# Patient Record
Sex: Male | Born: 1950 | Race: Black or African American | Hispanic: No | Marital: Single | Smoking: Former smoker
Health system: Southern US, Community
[De-identification: ages and names within clinical notes are randomized; demographics above are authoritative.]

## PROBLEM LIST (undated history)

## (undated) DIAGNOSIS — K922 Gastrointestinal hemorrhage, unspecified: Secondary | ICD-10-CM

## (undated) DIAGNOSIS — B1920 Unspecified viral hepatitis C without hepatic coma: Secondary | ICD-10-CM

## (undated) DIAGNOSIS — E785 Hyperlipidemia, unspecified: Secondary | ICD-10-CM

## (undated) DIAGNOSIS — J309 Allergic rhinitis, unspecified: Secondary | ICD-10-CM

## (undated) DIAGNOSIS — Z9289 Personal history of other medical treatment: Secondary | ICD-10-CM

## (undated) DIAGNOSIS — I10 Essential (primary) hypertension: Secondary | ICD-10-CM

## (undated) DIAGNOSIS — E119 Type 2 diabetes mellitus without complications: Secondary | ICD-10-CM

## (undated) DIAGNOSIS — E1069 Type 1 diabetes mellitus with other specified complication: Secondary | ICD-10-CM

---

## 2001-08-17 ENCOUNTER — Encounter: Admission: RE | Admit: 2001-08-17 | Discharge: 2001-11-15 | Payer: Self-pay | Admitting: Family Medicine

## 2001-10-10 ENCOUNTER — Encounter: Payer: Self-pay | Admitting: Gastroenterology

## 2001-10-10 ENCOUNTER — Ambulatory Visit (HOSPITAL_COMMUNITY): Admission: RE | Admit: 2001-10-10 | Discharge: 2001-10-10 | Payer: Self-pay | Admitting: Gastroenterology

## 2001-11-14 ENCOUNTER — Ambulatory Visit (HOSPITAL_COMMUNITY): Admission: RE | Admit: 2001-11-14 | Discharge: 2001-11-14 | Payer: Self-pay | Admitting: Gastroenterology

## 2001-11-14 ENCOUNTER — Encounter (INDEPENDENT_AMBULATORY_CARE_PROVIDER_SITE_OTHER): Payer: Self-pay | Admitting: Specialist

## 2001-11-14 ENCOUNTER — Encounter: Payer: Self-pay | Admitting: Gastroenterology

## 2002-10-26 ENCOUNTER — Encounter (INDEPENDENT_AMBULATORY_CARE_PROVIDER_SITE_OTHER): Payer: Self-pay | Admitting: Specialist

## 2002-10-26 ENCOUNTER — Ambulatory Visit (HOSPITAL_COMMUNITY): Admission: RE | Admit: 2002-10-26 | Discharge: 2002-10-26 | Payer: Self-pay | Admitting: Gastroenterology

## 2004-11-30 ENCOUNTER — Emergency Department (HOSPITAL_COMMUNITY): Admission: EM | Admit: 2004-11-30 | Discharge: 2004-12-01 | Payer: Self-pay | Admitting: Emergency Medicine

## 2009-02-01 ENCOUNTER — Emergency Department (HOSPITAL_COMMUNITY): Admission: EM | Admit: 2009-02-01 | Discharge: 2009-02-01 | Payer: Self-pay | Admitting: Emergency Medicine

## 2010-10-03 NOTE — Op Note (Signed)
   NAME:  Arthur Lawrence, Arthur Lawrence                          ACCOUNT NO.:  [id number]   MEDICAL RECORD NO.:  [id number]                   PATIENT TYPE:  AMB   LOCATION:  ENDO                                 FACILITY:  [facility]   PHYSICIAN:  John C. Madilyn Fireman, M.D.                 DATE OF BIRTH:  08/20/1950   DATE OF PROCEDURE:  10/26/2002  DATE OF DISCHARGE:                                 OPERATIVE REPORT   PROCEDURE:  Colonoscopy with polypectomy.   INDICATIONS FOR PROCEDURE:  Colon cancer screening.   DESCRIPTION OF PROCEDURE:  The patient was placed in the left lateral  decubitus position and placed on the pulse monitor with continuous low-flow  oxygen delivered by nasal cannula.  He was sedated with 62.5 mcg IV  fentanyl, 6 mg IV Versed.  The Olympus video colonoscope was inserted into  the rectum and advanced to the cecum, confirmed by transillumination of  McBurney's point and visualization of the ileocecal valve and appendiceal  orifice.  Prep was excellent.  The cecum, ascending and transverse all  appeared normal  with no masses, polyps, diverticula, or other mucosal  abnormalities.  Within the descending colon, there was a 1 cm sessile polyp  removed by snare.  A similar polyp was seen in the sigmoid colon.  The  remainder of the sigmoid and rectum appeared normal with no further polyps,  masses, diverticula, or other mucosal abnormalities. Retroflexed view of the  anus revealed no obvious internal hemorrhoids.  The colonoscope was then  withdrawn and the patient returned to the recovery room in stable condition.  He tolerated the procedure well and there were no immediate complications.   IMPRESSION:  Descending colon and sigmoid colon polyps.   PLAN:  Await histology to determine method and interval for future colon  screening.                                               John C. Madilyn Fireman, M.D.    [facility]/MEDQ  D:  10/26/2002  T:  10/26/2002  Job:  [id number]   cc:   Talmadge Coventry,  M.D.  [address], [address]  [place]  [place] [address]  Fax: [phone]

## 2011-05-19 HISTORY — PX: LAPAROSCOPIC CHOLECYSTECTOMY: SUR755

## 2014-02-26 ENCOUNTER — Encounter (HOSPITAL_COMMUNITY): Admission: EM | Disposition: E | Payer: Self-pay | Source: Home / Self Care | Attending: Internal Medicine

## 2014-02-26 ENCOUNTER — Inpatient Hospital Stay (HOSPITAL_COMMUNITY)
Admission: EM | Admit: 2014-02-26 | Discharge: 2014-03-18 | DRG: 377 | Disposition: E | Payer: BC Managed Care – PPO | Attending: Internal Medicine | Admitting: Internal Medicine

## 2014-02-26 ENCOUNTER — Encounter (HOSPITAL_COMMUNITY): Payer: Self-pay | Admitting: Emergency Medicine

## 2014-02-26 ENCOUNTER — Emergency Department (HOSPITAL_COMMUNITY): Payer: BC Managed Care – PPO

## 2014-02-26 DIAGNOSIS — Z7982 Long term (current) use of aspirin: Secondary | ICD-10-CM | POA: Diagnosis not present

## 2014-02-26 DIAGNOSIS — R4189 Other symptoms and signs involving cognitive functions and awareness: Secondary | ICD-10-CM

## 2014-02-26 DIAGNOSIS — R6510 Systemic inflammatory response syndrome (SIRS) of non-infectious origin without acute organ dysfunction: Secondary | ICD-10-CM | POA: Diagnosis present

## 2014-02-26 DIAGNOSIS — G8194 Hemiplegia, unspecified affecting left nondominant side: Secondary | ICD-10-CM | POA: Diagnosis present

## 2014-02-26 DIAGNOSIS — K219 Gastro-esophageal reflux disease without esophagitis: Secondary | ICD-10-CM | POA: Diagnosis present

## 2014-02-26 DIAGNOSIS — E118 Type 2 diabetes mellitus with unspecified complications: Secondary | ICD-10-CM

## 2014-02-26 DIAGNOSIS — R748 Abnormal levels of other serum enzymes: Secondary | ICD-10-CM | POA: Diagnosis present

## 2014-02-26 DIAGNOSIS — N179 Acute kidney failure, unspecified: Secondary | ICD-10-CM | POA: Diagnosis not present

## 2014-02-26 DIAGNOSIS — D62 Acute posthemorrhagic anemia: Secondary | ICD-10-CM | POA: Diagnosis present

## 2014-02-26 DIAGNOSIS — I739 Peripheral vascular disease, unspecified: Secondary | ICD-10-CM | POA: Diagnosis present

## 2014-02-26 DIAGNOSIS — I9589 Other hypotension: Secondary | ICD-10-CM

## 2014-02-26 DIAGNOSIS — R40244 Other coma, without documented Glasgow coma scale score, or with partial score reported, unspecified time: Secondary | ICD-10-CM

## 2014-02-26 DIAGNOSIS — R578 Other shock: Secondary | ICD-10-CM | POA: Diagnosis present

## 2014-02-26 DIAGNOSIS — K922 Gastrointestinal hemorrhage, unspecified: Secondary | ICD-10-CM | POA: Diagnosis present

## 2014-02-26 DIAGNOSIS — R569 Unspecified convulsions: Secondary | ICD-10-CM

## 2014-02-26 DIAGNOSIS — I6319 Cerebral infarction due to embolism of other precerebral artery: Secondary | ICD-10-CM | POA: Diagnosis present

## 2014-02-26 DIAGNOSIS — R40243 Glasgow coma scale score 3-8, unspecified time: Secondary | ICD-10-CM

## 2014-02-26 DIAGNOSIS — R000 Tachycardia, unspecified: Secondary | ICD-10-CM | POA: Diagnosis present

## 2014-02-26 DIAGNOSIS — K5731 Diverticulosis of large intestine without perforation or abscess with bleeding: Secondary | ICD-10-CM | POA: Diagnosis present

## 2014-02-26 DIAGNOSIS — R579 Shock, unspecified: Secondary | ICD-10-CM | POA: Diagnosis present

## 2014-02-26 DIAGNOSIS — E1069 Type 1 diabetes mellitus with other specified complication: Secondary | ICD-10-CM | POA: Diagnosis present

## 2014-02-26 DIAGNOSIS — R4182 Altered mental status, unspecified: Secondary | ICD-10-CM | POA: Diagnosis present

## 2014-02-26 DIAGNOSIS — E1065 Type 1 diabetes mellitus with hyperglycemia: Secondary | ICD-10-CM | POA: Diagnosis present

## 2014-02-26 DIAGNOSIS — R0603 Acute respiratory distress: Secondary | ICD-10-CM

## 2014-02-26 DIAGNOSIS — E878 Other disorders of electrolyte and fluid balance, not elsewhere classified: Secondary | ICD-10-CM | POA: Diagnosis not present

## 2014-02-26 DIAGNOSIS — E872 Acidosis: Secondary | ICD-10-CM | POA: Diagnosis not present

## 2014-02-26 DIAGNOSIS — I82611 Acute embolism and thrombosis of superficial veins of right upper extremity: Secondary | ICD-10-CM | POA: Diagnosis present

## 2014-02-26 DIAGNOSIS — Z833 Family history of diabetes mellitus: Secondary | ICD-10-CM

## 2014-02-26 DIAGNOSIS — D72829 Elevated white blood cell count, unspecified: Secondary | ICD-10-CM | POA: Diagnosis present

## 2014-02-26 DIAGNOSIS — E785 Hyperlipidemia, unspecified: Secondary | ICD-10-CM | POA: Diagnosis present

## 2014-02-26 DIAGNOSIS — I10 Essential (primary) hypertension: Secondary | ICD-10-CM | POA: Diagnosis present

## 2014-02-26 DIAGNOSIS — Z66 Do not resuscitate: Secondary | ICD-10-CM | POA: Diagnosis present

## 2014-02-26 DIAGNOSIS — K117 Disturbances of salivary secretion: Secondary | ICD-10-CM

## 2014-02-26 DIAGNOSIS — I6340 Cerebral infarction due to embolism of unspecified cerebral artery: Secondary | ICD-10-CM | POA: Diagnosis present

## 2014-02-26 DIAGNOSIS — R509 Fever, unspecified: Secondary | ICD-10-CM

## 2014-02-26 DIAGNOSIS — E870 Hyperosmolality and hypernatremia: Secondary | ICD-10-CM | POA: Diagnosis not present

## 2014-02-26 DIAGNOSIS — K7200 Acute and subacute hepatic failure without coma: Secondary | ICD-10-CM | POA: Diagnosis present

## 2014-02-26 DIAGNOSIS — R531 Weakness: Secondary | ICD-10-CM | POA: Diagnosis present

## 2014-02-26 DIAGNOSIS — R0600 Dyspnea, unspecified: Secondary | ICD-10-CM

## 2014-02-26 DIAGNOSIS — Z87891 Personal history of nicotine dependence: Secondary | ICD-10-CM | POA: Diagnosis not present

## 2014-02-26 DIAGNOSIS — Z8673 Personal history of transient ischemic attack (TIA), and cerebral infarction without residual deficits: Secondary | ICD-10-CM | POA: Diagnosis not present

## 2014-02-26 DIAGNOSIS — R339 Retention of urine, unspecified: Secondary | ICD-10-CM | POA: Diagnosis not present

## 2014-02-26 DIAGNOSIS — E119 Type 2 diabetes mellitus without complications: Secondary | ICD-10-CM

## 2014-02-26 DIAGNOSIS — G9341 Metabolic encephalopathy: Secondary | ICD-10-CM | POA: Diagnosis present

## 2014-02-26 DIAGNOSIS — Z823 Family history of stroke: Secondary | ICD-10-CM | POA: Diagnosis not present

## 2014-02-26 DIAGNOSIS — E875 Hyperkalemia: Secondary | ICD-10-CM | POA: Diagnosis not present

## 2014-02-26 DIAGNOSIS — I6389 Other cerebral infarction: Secondary | ICD-10-CM

## 2014-02-26 DIAGNOSIS — D696 Thrombocytopenia, unspecified: Secondary | ICD-10-CM | POA: Diagnosis not present

## 2014-02-26 DIAGNOSIS — R0689 Other abnormalities of breathing: Secondary | ICD-10-CM

## 2014-02-26 DIAGNOSIS — Z8249 Family history of ischemic heart disease and other diseases of the circulatory system: Secondary | ICD-10-CM | POA: Diagnosis not present

## 2014-02-26 DIAGNOSIS — R066 Hiccough: Secondary | ICD-10-CM

## 2014-02-26 DIAGNOSIS — T17910D Gastric contents in respiratory tract, part unspecified causing asphyxiation, subsequent encounter: Secondary | ICD-10-CM

## 2014-02-26 DIAGNOSIS — E0865 Diabetes mellitus due to underlying condition with hyperglycemia: Secondary | ICD-10-CM

## 2014-02-26 DIAGNOSIS — R5383 Other fatigue: Secondary | ICD-10-CM

## 2014-02-26 DIAGNOSIS — T17910A Gastric contents in respiratory tract, part unspecified causing asphyxiation, initial encounter: Secondary | ICD-10-CM

## 2014-02-26 DIAGNOSIS — R400 Somnolence: Secondary | ICD-10-CM

## 2014-02-26 DIAGNOSIS — K625 Hemorrhage of anus and rectum: Secondary | ICD-10-CM | POA: Diagnosis present

## 2014-02-26 DIAGNOSIS — Z515 Encounter for palliative care: Secondary | ICD-10-CM | POA: Diagnosis not present

## 2014-02-26 DIAGNOSIS — I639 Cerebral infarction, unspecified: Secondary | ICD-10-CM | POA: Diagnosis present

## 2014-02-26 DIAGNOSIS — Z9229 Personal history of other drug therapy: Secondary | ICD-10-CM | POA: Diagnosis not present

## 2014-02-26 DIAGNOSIS — R40241 Glasgow coma scale score 13-15, unspecified time: Secondary | ICD-10-CM

## 2014-02-26 HISTORY — DX: Unspecified viral hepatitis C without hepatic coma: B19.20

## 2014-02-26 HISTORY — DX: Type 1 diabetes mellitus with other specified complication: E10.69

## 2014-02-26 HISTORY — DX: Essential (primary) hypertension: I10

## 2014-02-26 HISTORY — PX: ESOPHAGOGASTRODUODENOSCOPY: SHX5428

## 2014-02-26 HISTORY — DX: Gastrointestinal hemorrhage, unspecified: K92.2

## 2014-02-26 HISTORY — PX: COLONOSCOPY: SHX5424

## 2014-02-26 HISTORY — DX: Allergic rhinitis, unspecified: J30.9

## 2014-02-26 HISTORY — DX: Hyperlipidemia, unspecified: E78.5

## 2014-02-26 HISTORY — DX: Type 2 diabetes mellitus without complications: E11.9

## 2014-02-26 HISTORY — DX: Personal history of other medical treatment: Z92.89

## 2014-02-26 LAB — COMPREHENSIVE METABOLIC PANEL
ALT: 66 U/L — ABNORMAL HIGH (ref 0–53)
ANION GAP: 16 — AB (ref 5–15)
AST: 59 U/L — ABNORMAL HIGH (ref 0–37)
Albumin: 3 g/dL — ABNORMAL LOW (ref 3.5–5.2)
Alkaline Phosphatase: 70 U/L (ref 39–117)
BUN: 31 mg/dL — AB (ref 6–23)
CALCIUM: 8.7 mg/dL (ref 8.4–10.5)
CO2: 19 mEq/L (ref 19–32)
Chloride: 106 mEq/L (ref 96–112)
Creatinine, Ser: 1.28 mg/dL (ref 0.50–1.35)
GFR calc non Af Amer: 58 mL/min — ABNORMAL LOW (ref >90)
GFR, EST AFRICAN AMERICAN: 67 mL/min — AB (ref >90)
Glucose, Bld: 323 mg/dL — ABNORMAL HIGH (ref 70–99)
Potassium: 4.8 mEq/L (ref 3.7–5.3)
Sodium: 141 mEq/L (ref 137–147)
TOTAL PROTEIN: 6 g/dL (ref 6.0–8.3)
Total Bilirubin: 0.6 mg/dL (ref 0.3–1.2)

## 2014-02-26 LAB — GLUCOSE, CAPILLARY
GLUCOSE-CAPILLARY: 235 mg/dL — AB (ref 70–99)
Glucose-Capillary: 229 mg/dL — ABNORMAL HIGH (ref 70–99)
Glucose-Capillary: 241 mg/dL — ABNORMAL HIGH (ref 70–99)
Glucose-Capillary: 335 mg/dL — ABNORMAL HIGH (ref 70–99)

## 2014-02-26 LAB — PROTIME-INR
INR: 1.15 (ref 0.00–1.49)
PROTHROMBIN TIME: 14.7 s (ref 11.6–15.2)

## 2014-02-26 LAB — I-STAT CHEM 8, ED
BUN: 29 mg/dL — ABNORMAL HIGH (ref 6–23)
Calcium, Ion: 1.21 mmol/L (ref 1.13–1.30)
Chloride: 109 mEq/L (ref 96–112)
Creatinine, Ser: 1.3 mg/dL (ref 0.50–1.35)
Glucose, Bld: 323 mg/dL — ABNORMAL HIGH (ref 70–99)
HEMATOCRIT: 31 % — AB (ref 39.0–52.0)
HEMOGLOBIN: 10.5 g/dL — AB (ref 13.0–17.0)
POTASSIUM: 4.7 meq/L (ref 3.7–5.3)
Sodium: 141 mEq/L (ref 137–147)
TCO2: 18 mmol/L (ref 0–100)

## 2014-02-26 LAB — PREPARE RBC (CROSSMATCH)

## 2014-02-26 LAB — CBC
HCT: 30.6 % — ABNORMAL LOW (ref 39.0–52.0)
Hemoglobin: 10.5 g/dL — ABNORMAL LOW (ref 13.0–17.0)
MCH: 30.7 pg (ref 26.0–34.0)
MCHC: 34.3 g/dL (ref 30.0–36.0)
MCV: 89.5 fL (ref 78.0–100.0)
Platelets: 236 10*3/uL (ref 150–400)
RBC: 3.42 MIL/uL — AB (ref 4.22–5.81)
RDW: 12.9 % (ref 11.5–15.5)
WBC: 13.2 10*3/uL — ABNORMAL HIGH (ref 4.0–10.5)

## 2014-02-26 LAB — I-STAT TROPONIN, ED: Troponin i, poc: 0.01 ng/mL (ref 0.00–0.08)

## 2014-02-26 LAB — URINALYSIS, ROUTINE W REFLEX MICROSCOPIC
BILIRUBIN URINE: NEGATIVE
Hgb urine dipstick: NEGATIVE
Ketones, ur: 15 mg/dL — AB
LEUKOCYTES UA: NEGATIVE
Nitrite: NEGATIVE
PH: 5.5 (ref 5.0–8.0)
Protein, ur: NEGATIVE mg/dL
Specific Gravity, Urine: >1.046 — ABNORMAL HIGH (ref 1.005–1.030)
Urobilinogen, UA: 0.2 mg/dL (ref 0.0–1.0)

## 2014-02-26 LAB — CBG MONITORING, ED: Glucose-Capillary: 293 mg/dL — ABNORMAL HIGH (ref 70–99)

## 2014-02-26 LAB — DIFFERENTIAL
Basophils Absolute: 0 10*3/uL (ref 0.0–0.1)
Basophils Relative: 0 % (ref 0–1)
EOS PCT: 1 % (ref 0–5)
Eosinophils Absolute: 0.1 10*3/uL (ref 0.0–0.7)
LYMPHS ABS: 2.5 10*3/uL (ref 0.7–4.0)
Lymphocytes Relative: 19 % (ref 12–46)
Monocytes Absolute: 0.4 10*3/uL (ref 0.1–1.0)
Monocytes Relative: 3 % (ref 3–12)
Neutro Abs: 10.2 10*3/uL — ABNORMAL HIGH (ref 1.7–7.7)
Neutrophils Relative %: 77 % (ref 43–77)

## 2014-02-26 LAB — ABO/RH: ABO/RH(D): AB POS

## 2014-02-26 LAB — RAPID URINE DRUG SCREEN, HOSP PERFORMED
Amphetamines: NOT DETECTED
BENZODIAZEPINES: NOT DETECTED
Barbiturates: NOT DETECTED
Cocaine: NOT DETECTED
Opiates: NOT DETECTED
Tetrahydrocannabinol: NOT DETECTED

## 2014-02-26 LAB — POC OCCULT BLOOD, ED: FECAL OCCULT BLD: POSITIVE — AB

## 2014-02-26 LAB — HEMOGLOBIN AND HEMATOCRIT, BLOOD
HEMATOCRIT: 22.5 % — AB (ref 39.0–52.0)
HEMOGLOBIN: 8 g/dL — AB (ref 13.0–17.0)

## 2014-02-26 LAB — URINE MICROSCOPIC-ADD ON

## 2014-02-26 LAB — MRSA PCR SCREENING: MRSA by PCR: NEGATIVE

## 2014-02-26 LAB — APTT: aPTT: 25 seconds (ref 24–37)

## 2014-02-26 LAB — ETHANOL

## 2014-02-26 SURGERY — EGD (ESOPHAGOGASTRODUODENOSCOPY)
Anesthesia: Moderate Sedation

## 2014-02-26 MED ORDER — SODIUM CHLORIDE 0.9 % IJ SOLN: 3.0000 mL | INTRAMUSCULAR | Status: DC | PRN

## 2014-02-26 MED ORDER — SODIUM CHLORIDE 0.9 % IV SOLN: Freq: Once | INTRAVENOUS | Status: DC

## 2014-02-26 MED ORDER — SODIUM CHLORIDE 0.9 % IV SOLN: INTRAVENOUS | Status: DC

## 2014-02-26 MED ORDER — IOHEXOL 350 MG/ML SOLN
80.0000 mL | Freq: Once | INTRAVENOUS | Status: AC | PRN
  Administered 2014-02-26: 80 mL via INTRAVENOUS

## 2014-02-26 MED ORDER — SODIUM CHLORIDE 0.9 % IV SOLN: 250.0000 mL | INTRAVENOUS | Status: DC | PRN

## 2014-02-26 MED ORDER — SODIUM CHLORIDE 0.9 % IV BOLUS (SEPSIS)
1000.0000 mL | Freq: Once | INTRAVENOUS | Status: AC
  Administered 2014-02-26: 1000 mL via INTRAVENOUS

## 2014-02-26 MED ORDER — ONDANSETRON HCL 4 MG PO TABS: 4.0000 mg | ORAL_TABLET | Freq: Four times a day (QID) | ORAL | Status: DC | PRN

## 2014-02-26 MED ORDER — ONDANSETRON HCL 4 MG/2ML IJ SOLN
4.0000 mg | Freq: Four times a day (QID) | INTRAMUSCULAR | Status: DC | PRN
  Administered 2014-02-28: 4 mg via INTRAVENOUS

## 2014-02-26 MED ORDER — SODIUM CHLORIDE 0.9 % IV SOLN: 10.0000 mL/h | Freq: Once | INTRAVENOUS | Status: DC

## 2014-02-26 MED ORDER — SODIUM CHLORIDE 0.9 % IJ SOLN: 3.0000 mL | Freq: Two times a day (BID) | INTRAMUSCULAR | Status: DC

## 2014-02-26 MED ORDER — MIDAZOLAM HCL 10 MG/2ML IJ SOLN
INTRAMUSCULAR | Status: DC | PRN
  Administered 2014-02-26 (×2): 1 mg via INTRAVENOUS

## 2014-02-26 MED ORDER — FENTANYL CITRATE 0.05 MG/ML IJ SOLN
INTRAMUSCULAR | Status: AC
  Filled 2014-02-26: qty 2

## 2014-02-26 MED ORDER — SODIUM CHLORIDE 0.9 % IJ SOLN
3.0000 mL | Freq: Two times a day (BID) | INTRAMUSCULAR | Status: DC
  Administered 2014-02-26 – 2014-03-02 (×5): 3 mL via INTRAVENOUS

## 2014-02-26 MED ORDER — BUTAMBEN-TETRACAINE-BENZOCAINE 2-2-14 % EX AERO
INHALATION_SPRAY | CUTANEOUS | Status: DC | PRN
  Administered 2014-02-26: 2 via TOPICAL

## 2014-02-26 MED ORDER — MIDAZOLAM HCL 5 MG/ML IJ SOLN
INTRAMUSCULAR | Status: AC
  Filled 2014-02-26: qty 2

## 2014-02-26 MED ORDER — STROKE: EARLY STAGES OF RECOVERY BOOK
Freq: Once | Status: DC
  Filled 2014-02-26: qty 1

## 2014-02-26 MED ORDER — SODIUM CHLORIDE 0.9 % IV SOLN
INTRAVENOUS | Status: DC
  Administered 2014-02-26: 23:00:00 via INTRAVENOUS

## 2014-02-26 MED ORDER — PANTOPRAZOLE SODIUM 40 MG IV SOLR
80.0000 mg | Freq: Once | INTRAVENOUS | Status: AC
  Administered 2014-02-26: 80 mg via INTRAVENOUS
  Filled 2014-02-26: qty 80

## 2014-02-26 MED ORDER — SODIUM CHLORIDE 0.9 % IV SOLN
Freq: Once | INTRAVENOUS | Status: AC
Start: 2014-02-26 — End: 2014-03-01
  Administered 2014-03-01: 14:00:00 via INTRAVENOUS

## 2014-02-26 MED ORDER — SODIUM CHLORIDE 0.9 % IV BOLUS (SEPSIS)
500.0000 mL | Freq: Once | INTRAVENOUS | Status: AC
  Administered 2014-02-26: 500 mL via INTRAVENOUS

## 2014-02-26 MED ORDER — INSULIN ASPART 100 UNIT/ML ~~LOC~~ SOLN
0.0000 [IU] | SUBCUTANEOUS | Status: DC
  Administered 2014-02-26: 3 [IU] via SUBCUTANEOUS
  Administered 2014-02-26: 7 [IU] via SUBCUTANEOUS
  Administered 2014-02-27 (×2): 5 [IU] via SUBCUTANEOUS

## 2014-02-26 MED ORDER — DIPHENHYDRAMINE HCL 50 MG/ML IJ SOLN
INTRAMUSCULAR | Status: AC
  Filled 2014-02-26: qty 1

## 2014-02-26 NOTE — ED Notes (Signed)
Pt attempted to stand with wife to use urinal and had a near syncopal episode, pt assisted back into bed and cleaned of incontinent clots from rectum and urine

## 2014-02-26 NOTE — Consult Note (Signed)
Referring Provider: Dr. Marlin Canary Primary Care Physician:  Maryelizabeth Rowan, MD Primary Gastroenterologist:  Dr. Dorena Cookey  Reason for Consultation:  Hematochezia  HPI: Arthur Lawrence is a 63 y.o. male admitted through the emergency room this morning because of multiple episodes of painless large volume hematochezia. The patient has no prior history of GI bleeding or peptic ulcer disease. He is on an 81 mg aspirin daily, but denies prodromal dyspeptic symptoms such as anorexia, reflux, weight loss, etc. He has not had syncope with the hematochezia. His hemoglobin in the emergency room was 10, and his blood pressure has been in the 70-90 range systolic. 11 years ago, he had colonoscopy by Dr. Dorena Cookey that showed a couple of tubulovillous adenomas, which apparently have never been followed up in. At that time, no diverticular disease was noted. He also has a past history of chronic active hepatitis in biopsy, without fibrosis, biopsied in 2003 and evaluated by Dr. Ritta Slot at that time.   Past Medical History  Diagnosis Date  . Diabetes mellitus without complication   . Hypertension   . Allergic rhinitis   . Hyperlipidemia due to type 1 diabetes mellitus     History reviewed. No pertinent past surgical history.  Prior to Admission medications   Medication Sig Start Date End Date Taking? Authorizing Provider  aspirin EC 81 MG tablet Take 81 mg by mouth daily.   Yes Historical Provider, MD  Canagliflozin (INVOKANA) 300 MG TABS Take 300 mg by mouth daily.   Yes Historical Provider, MD  lisinopril (PRINIVIL,ZESTRIL) 40 MG tablet Take 40 mg by mouth daily.   Yes Historical Provider, MD  metFORMIN (GLUCOPHAGE-XR) 500 MG 24 hr tablet Take 500 mg by mouth daily with breakfast.   Yes Historical Provider, MD  Omega-3 Fatty Acids (FISH OIL PO) Take 1 tablet by mouth daily.   Yes Historical Provider, MD  OVER THE COUNTER MEDICATION Take 1 tablet by mouth daily. Cut   Yes Historical Provider, MD     Current Facility-Administered Medications  Medication Dose Route Frequency Provider Last Rate Last Dose  .  stroke: mapping our early stages of recovery book   Does not apply Once Jessica U Vann, DO      . 0.9 %  sodium chloride infusion   Intravenous STAT Suzi Roots, MD 125 mL/hr at 02/28/2014 1341    . 0.9 %  sodium chloride infusion   Intravenous Once Suzi Roots, MD 10 mL/hr at 03/13/2014 1341    . 0.9 %  sodium chloride infusion  250 mL Intravenous PRN Joseph Art, DO      . insulin aspart (novoLOG) injection 0-9 Units  0-9 Units Subcutaneous 6 times per day Joseph Art, DO      . ondansetron (ZOFRAN) tablet 4 mg  4 mg Oral Q6H PRN Joseph Art, DO       Or  . ondansetron (ZOFRAN) injection 4 mg  4 mg Intravenous Q6H PRN Joseph Art, DO      . sodium chloride 0.9 % injection 3 mL  3 mL Intravenous Q12H Jessica U Vann, DO      . sodium chloride 0.9 % injection 3 mL  3 mL Intravenous Q12H Jessica U Vann, DO      . sodium chloride 0.9 % injection 3 mL  3 mL Intravenous PRN Joseph Art, DO        Allergies as of 02/21/2014  . (No Known Allergies)  Family history negative for colon cancer or liver disease.  History   Social History  . Marital Status:  married, wife Doylene BodeBarbara     Spouse Name: N/A    Number of Children: N/A  . Years of Education: N/A   Occupational History  . Not on file.   Social History Main Topics  . Smoking status: Never Smoker   . Smokeless tobacco: Not on file  . Alcohol Use:  occasional   . Drug Use: Not on file  . Sexual Activity: Not on file   Other Topics Concern  . Not on file   Social History Narrative  . No narrative on file    Review of Systems: Occasional choking or strangling when swallowing, occasional transient dysphagia.   No anorexia, weight loss, chronic abdominal pain, chronic nausea or vomiting, reflux symptomatology, prior hematochezia, hemorrhoidal bleeding, urinary difficulties  Physical Exam: Vital  signs in last 24 hours: Temp:  [98 F (36.7 C)-98.7 F (37.1 C)] 98.7 F (37.1 C) (10/12 1500) Pulse Rate:  [98-113] 104 (10/12 1400) Resp:  [14-23] 18 (10/12 1400) BP: (76-125)/(43-71) 105/67 mmHg (10/12 1500) SpO2:  [99 %-100 %] 100 % (10/12 1400)   General:   Alert,  Well-developed, well-nourished, pleasant and cooperative in NAD Head:  Normocephalic and atraumatic. Eyes:  Sclera clear, no icterus.   Conjunctiva pale. Mouth:   No ulcerations or lesions.  Oropharynx pink & moist. Neck:   No masses or thyromegaly. Lungs:  Clear throughout to auscultation.   No wheezes, crackles, or rhonchi. No evident respiratory distress. Heart:   Regular rate and rhythm; no murmurs, clicks, rubs,  or gallops. Abdomen:  Soft, nontender, mildly tympanitic, and nondistended. No masses, hepatosplenomegaly or ventral hernias noted. Normal bowel sounds, without bruits, guarding, or rebound.   Rectal:  See colonoscopy report   Msk:   Symmetrical without gross deformities. Pulses:  Normal radial pulse noted. Extremities:   Without clubbing, cyanosis, or edema. Neurologic:  Alert and coherent;  grossly normal neurologically. Skin:  Intact without significant lesions or rashes. Cervical Nodes:  No significant cervical adenopathy. Psych:   Alert and cooperative. Normal mood and affect.  Intake/Output from previous day:   Intake/Output this shift: Total I/O In: 1030 [I.V.:1000; Blood:30] Out: -   Lab Results:  Recent Labs  03/16/2014 0934 02/25/2014 0953  WBC 13.2*  --   HGB 10.5* 10.5*  HCT 30.6* 31.0*  PLT 236  --    BMET  Recent Labs  02/19/2014 0934 03/02/2014 0953  NA 141 141  K 4.8 4.7  CL 106 109  CO2 19  --   GLUCOSE 323* 323*  BUN 31* 29*  CREATININE 1.28 1.30  CALCIUM 8.7  --    LFT  Recent Labs  03/08/2014 0934  PROT 6.0  ALBUMIN 3.0*  AST 59*  ALT 66*  ALKPHOS 70  BILITOT 0.6   PT/INR  Recent Labs  02/25/2014 0934  LABPROT 14.7  INR 1.15    Studies/Results: Ct  Angio Head W/cm &/or Wo Cm  03/11/2014   CLINICAL DATA:  Acute onset of dizziness and left-sided weakness. Confusion.  EXAM: CT ANGIOGRAPHY HEAD AND NECK  TECHNIQUE: Multidetector CT imaging of the head and neck was performed using the standard protocol during bolus administration of intravenous contrast. Multiplanar CT image reconstructions and MIPs were obtained to evaluate the vascular anatomy. Carotid stenosis measurements (when applicable) are obtained utilizing NASCET criteria, using the distal internal carotid diameter as the denominator.  CONTRAST:  80mL  OMNIPAQUE IOHEXOL 350 MG/ML SOLN  COMPARISON:  CT without contrast.  FINDINGS: CTA HEAD FINDINGS  The source images demonstrate normal gray-white differentiation the cortex and basal ganglia. No acute cortical infarct is evident. Focal hypoattenuation is noted within the brainstem and brainstem infarct is not excluded. This finding is consistent across the pre and postcontrast images.  Atherosclerotic calcifications are present within the cavernous carotid arteries without significant stenoses relative to the more distal vessels. The ICA terminus is intact bilaterally. The A1 and M1 segments are normal. No significant anterior communicating artery is evident. The the MCA bifurcations are intact. The ACA and MCA branch vessels are within normal limits.  As stated, there is focal atherosclerotic calcification involving the dural margin of the left vertebral artery without a significant stenosis. The PICA origins are visualized and normal. The basilar artery is within normal limits. Both posterior cerebral arteries originate from the basilar tip. The PCA branch vessels are unremarkable.  The dural sinuses are patent.  Review of the MIP images confirms the above findings.  CTA NECK FINDINGS  The aortic arch is incompletely evaluated. The vertebral arteries originate from the subclavian arteries. The left vertebral artery is dominant. Atherosclerotic changes  are present at the origins of both vertebral arteries. A moderate to high-grade stenosis is present on the right. No additional stenoses are evident. The vertebral arteries are codominant at the level of the dural margin. Vascular calcifications are present in the left vertebral artery at the foramen magnum.  The right common carotid artery is within normal limits. Minimal atherosclerotic calcifications are present in the proximal right ICA. There is no significant stenosis.  The left common carotid artery is within normal limits. The bifurcation demonstrates minimal atherosclerotic calcification. There is no significant stenosis. Cervical ICA is normal.  The soft tissues of the neck are unremarkable.  Review of the MIP images confirms the above findings.  IMPRESSION: 1. Age indeterminate infarcts involving the brainstem. 2. No significant supratentorial infarct. 3. 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 The vertebral arteries are codominant distally. 4. Minimal atherosclerotic changes at the carotid bifurcations bilaterally without significant stenosis. 5. Atherosclerotic changes within the cavernous carotid arteries bilaterally without significant stenosis. 6. Mild distal small vessel disease. These results were called by telephone at the time of interpretation on 03/10/2014 at 12:15 pm to Dr. Thana Farr , who verbally acknowledged these results.   Electronically Signed   By: Gennette Pac M.D.   On: 03/09/2014 12:15   Ct Head Wo Contrast  03/16/2014   CLINICAL DATA:  Code stroke.  Altered mental status.  EXAM: CT HEAD WITHOUT CONTRAST  TECHNIQUE: Contiguous axial images were obtained from the base of the skull through the vertex without intravenous contrast.  COMPARISON:  None.  FINDINGS: Extensive age advanced chronic microvascular changes throughout the deep white matter. Areas of low density noted within the pons, likely old infarcts. No acute infarction. No  hemorrhage or hydrocephalus. No acute calvarial abnormality.  IMPRESSION: Age advanced chronic microvascular changes throughout the deep white matter.  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  Critical Value/emergent results were called by telephone at the time of interpretation on 02/15/2014 at 10:00 am to Dr. Thad Ranger , who verbally acknowledged these results.   Electronically Signed   By: Charlett Nose M.D.   On: 02/27/2014 10:01   Ct Angio Neck W/cm &/or Wo/cm  02/19/2014   CLINICAL DATA:  Acute onset of dizziness and left-sided weakness. Confusion.  EXAM: CT ANGIOGRAPHY HEAD  AND NECK  TECHNIQUE: Multidetector CT imaging of the head and neck was performed using the standard protocol during bolus administration of intravenous contrast. Multiplanar CT image reconstructions and MIPs were obtained to evaluate the vascular anatomy. Carotid stenosis measurements (when applicable) are obtained utilizing NASCET criteria, using the distal internal carotid diameter as the denominator.  CONTRAST:  80mL OMNIPAQUE IOHEXOL 350 MG/ML SOLN  COMPARISON:  CT without contrast.  FINDINGS: CTA HEAD FINDINGS  The source images demonstrate normal gray-white differentiation the cortex and basal ganglia. No acute cortical infarct is evident. Focal hypoattenuation is noted within the brainstem and brainstem infarct is not excluded. This finding is consistent across the pre and postcontrast images.  Atherosclerotic calcifications are present within the cavernous carotid arteries without significant stenoses relative to the more distal vessels. The ICA terminus is intact bilaterally. The A1 and M1 segments are normal. No significant anterior communicating artery is evident. The the MCA bifurcations are intact. The ACA and MCA branch vessels are within normal limits.  As stated, there is focal atherosclerotic calcification involving the dural margin of the left vertebral artery without a significant stenosis. The PICA origins are visualized and  normal. The basilar artery is within normal limits. Both posterior cerebral arteries originate from the basilar tip. The PCA branch vessels are unremarkable.  The dural sinuses are patent.  Review of the MIP images confirms the above findings.  CTA NECK FINDINGS  The aortic arch is incompletely evaluated. The vertebral arteries originate from the subclavian arteries. The left vertebral artery is dominant. Atherosclerotic changes are present at the origins of both vertebral arteries. A moderate to high-grade stenosis is present on the right. No additional stenoses are evident. The vertebral arteries are codominant at the level of the dural margin. Vascular calcifications are present in the left vertebral artery at the foramen magnum.  The right common carotid artery is within normal limits. Minimal atherosclerotic calcifications are present in the proximal right ICA. There is no significant stenosis.  The left common carotid artery is within normal limits. The bifurcation demonstrates minimal atherosclerotic calcification. There is no significant stenosis. Cervical ICA is normal.  The soft tissues of the neck are unremarkable.  Review of the MIP images confirms the above findings.  IMPRESSION: 1. Age indeterminate infarcts involving the brainstem. 2. No significant supratentorial infarct. 3. 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 The vertebral arteries are codominant distally. 4. Minimal atherosclerotic changes at the carotid bifurcations bilaterally without significant stenosis. 5. Atherosclerotic changes within the cavernous carotid arteries bilaterally without significant stenosis. 6. Mild distal small vessel disease. These results were called by telephone at the time of interpretation on 02/17/2014 at 12:15 pm to Dr. Thana FarrLESLIE REYNOLDS , who verbally acknowledged these results.   Electronically Signed   By: Gennette Pachris  Mattern M.D.   On: 03/02/2014 12:15    Impression: 1. Recurrent  hematochezia, painless, with slight elevation of BUN and mild hemodynamic instability, in a patient on aspirin and without prior diverticula on colonoscopy 10 years ago. The overall picture, clinically, is most compatible with a lower GI bleed because this amount of burgundy and red blood per rectum would almost certainly be associated with hemodynamic instability if it were from an upper tract source. I would also expect the BUN to be higher.  2. Posthemorrhagic anemia, acute, moderate  3. Prior history of "chronic active hepatitis" on liver biopsy with current mild elevation of transaminases  Plan: 1. Endoscopic evaluation to exclude an upper tract  source (doubt) 2. If the endoscopy is negative, proceed to sigmoidoscopic evaluation to look for diverticulosis. It is not felt likely that sigmoidoscopy would be fine a specific site of bleeding, but it could at least give general information such as the absence of colitis, the presence of diverticulosis, etc. 3. The nature, purpose, and risks of these procedures have been reviewed with the patient and he is agreeable. 4. I will obtain hepatitis serologies and will be watching for varices at the time of his endoscopy.   LOS: 0 days   Hero Kulish V  03/02/2014, 3:04 PM

## 2014-02-26 NOTE — Progress Notes (Signed)
Utilization Review Completed.  

## 2014-02-26 NOTE — Code Documentation (Signed)
Last known well this am at 0530.  He was up to the bathroom and noted bright red blood in his stool.  Later he felt dizzy and had some difficulty speaking.  Patient arrived via EMS at 0936 head CT and labs done.  Per patient and EMS patient with left side weakness and difficulty speaking. NIHSS 0. Dr Thad Rangereynolds at bedside to assess patient.  Plan CTA

## 2014-02-26 NOTE — Progress Notes (Addendum)
03/08/2014 patient transfer from emergency room to 2 central at 1430. He is alert, oriented and weak. Patient arrive to unit and he was receiving blood. He had clots of blood and eventually vomited. Patient was place on telemetry and central monitoring and elink was called. Patient went to endo,  And came back to room, but was have dark blood twice.  Patient skin look fine Patient received second unit of blood. [facility] Bul[facility].

## 2014-02-26 NOTE — ED Notes (Signed)
Blood transfusing on admission

## 2014-02-26 NOTE — Progress Notes (Signed)
Text page sent to NP Schorr, Pt H&H after 2 units PRBC hmg 8,0. Pts sats are 100% but wife who is RN is very concerned because breathing is labored at 30 bpm. Pt is minimally unresponsive. Orders placed for stat ABG, 2 units of PRBC and NS @ 15700ml/hr.

## 2014-02-26 NOTE — Consult Note (Addendum)
Referring Physician: Denton LankSteinl    Chief Complaint: Left sided weakness  HPI: Arthur Lawrence is an 63 y.o. male who reports that he awakened early this morning and went to the bathroom.  He noted some blood in his stools but was able to go back to sleep.  When he awakened again about 30 minutes later he had more blood in hid stools but noted on getting up that he was dizzy and had some left sided weakness.  He had two more bouts of bloody stools.  Family also noted some confusion and EMS was called.  Code stroke was called in the ED.    Date last known well: Date: 2013-08-26 Time last known well: Time: 05:30 tPA Given: No: Active GI bleed, resolution of symptoms  Past Medical History  Diagnosis Date  . Diabetes mellitus without complication   . Hypertension   . Allergic rhinitis   . Hyperlipidemia due to type 1 diabetes mellitus     History reviewed. No pertinent past surgical history.  Family history: Father with diabetes, CAD.  Mother with hypertension and CHF.  Maternal aunt with stroke  Social History:  Quit smoking 16 years ago.  He has no history of alcohol or illicit drug abuse.    Allergies: No Known Allergies  Medications: I have reviewed the patient's current medications. Prior to Admission:  Current outpatient prescriptions: aspirin EC 81 MG tablet, Take 81 mg by mouth daily., Disp: , Rfl: ;   Canagliflozin (INVOKANA) 300 MG TABS, Take 300 mg by mouth daily., Disp: , Rfl: ;  lisinopril (PRINIVIL,ZESTRIL) 40 MG tablet, Take 40 mg by mouth daily., Disp: , Rfl: ;   metFORMIN (GLUCOPHAGE-XR) 500 MG 24 hr tablet, Take 500 mg by mouth daily with breakfast., Disp: , Rfl:  Omega-3 Fatty Acids (FISH OIL PO), Take 1 tablet by mouth daily., Disp: , Rfl: ;   OVER THE COUNTER MEDICATION, Take 1 tablet by mouth daily. Cut, Disp: , Rfl:   ROS: History obtained from the patient  General ROS: negative for - chills, fatigue, fever, night sweats, weight gain or weight loss Psychological  ROS: negative for - behavioral disorder, hallucinations, memory difficulties, mood swings or suicidal ideation Ophthalmic ROS: negative for - blurry vision, double vision, eye pain or loss of vision ENT ROS: negative for - epistaxis, nasal discharge, oral lesions, sore throat, tinnitus Allergy and Immunology ROS: negative for - hives or itchy/watery eyes Hematological and Lymphatic ROS: negative for - bleeding problems, bruising or swollen lymph nodes Endocrine ROS: negative for - galactorrhea, hair pattern changes, polydipsia/polyuria or temperature intolerance Respiratory ROS: negative for - cough, hemoptysis, shortness of breath or wheezing Cardiovascular ROS: negative for - chest pain, dyspnea on exertion, edema or irregular heartbeat Gastrointestinal ROS: as noted in HPI Genito-Urinary ROS: negative for - dysuria, hematuria, incontinence or urinary frequency/urgency Musculoskeletal ROS: negative for - joint swelling or muscular weakness Neurological ROS: as noted in HPI Dermatological ROS: negative for rash and skin lesion changes  Physical Examination: Blood pressure 81/44, pulse 103, temperature 98 F (36.7 C), temperature source Oral, resp. rate 21, SpO2 100.00%.  Neurologic Examination: Mental Status: Alert, oriented, thought content appropriate.  Speech fluent without evidence of aphasia.  Nasal speech.  Able to follow 3 step commands without difficulty. Cranial Nerves: II: Discs flat bilaterally; Visual fields grossly normal, pupils equal, round, reactive to light and accommodation III,IV, VI: ptosis not present, extra-ocular motions intact bilaterally V,VII: smile symmetric, facial light touch sensation normal bilaterally VIII: hearing normal  bilaterally IX,X: gag reflex present XI: bilateral shoulder shrug XII: midline tongue extension Motor: Right : Upper extremity   5/5    Left:     Upper extremity   5/5  Lower extremity   5/5     Lower extremity   5/5 Tone and  bulk:normal tone throughout; no atrophy noted Sensory: Pinprick and light touch intact throughout, bilaterally Deep Tendon Reflexes: 2+ and symmetric throughout Plantars: Right: downgoing   Left: upgoing Cerebellar: normal finger-to-nose, normal rapid alternating movements and normal heel-to-shin test Gait: Unable to test due to hypotension CV: pulses palpable throughout     Laboratory Studies:  Basic Metabolic Panel:  Recent Labs Lab 02/17/2014 0953  NA 141  K 4.7  CL 109  GLUCOSE 323*  BUN 29*  CREATININE 1.30    Liver Function Tests: No results found for this basename: AST, ALT, ALKPHOS, BILITOT, PROT, ALBUMIN,  in the last 168 hours No results found for this basename: LIPASE, AMYLASE,  in the last 168 hours No results found for this basename: AMMONIA,  in the last 168 hours  CBC:  Recent Labs Lab 03/01/2014 0934 02/20/2014 0953  WBC 13.2*  --   NEUTROABS 10.2*  --   HGB 10.5* 10.5*  HCT 30.6* 31.0*  MCV 89.5  --   PLT 236  --     Cardiac Enzymes: No results found for this basename: CKTOTAL, CKMB, CKMBINDEX, TROPONINI,  in the last 168 hours  BNP: No components found with this basename: POCBNP,   CBG:  Recent Labs Lab 03/13/2014 1002  GLUCAP 293*    Microbiology: No results found for this or any previous visit.  Coagulation Studies: No results found for this basename: LABPROT, INR,  in the last 72 hours  Urinalysis: No results found for this basename: COLORURINE, APPERANCEUR, LABSPEC, PHURINE, GLUCOSEU, HGBUR, BILIRUBINUR, KETONESUR, PROTEINUR, UROBILINOGEN, NITRITE, LEUKOCYTESUR,  in the last 168 hours  Lipid Panel: No results found for this basename: chol,  trig,  hdl,  cholhdl,  vldl,  ldlcalc    HgbA1C:  No results found for this basename: HGBA1C    Urine Drug Screen:   No results found for this basename: labopia,  cocainscrnur,  labbenz,  amphetmu,  thcu,  labbarb    Alcohol Level: No results found for this basename: ETH,  in the last 168  hours  Other results: EKG: sinus tachycardia at 104 bpm.  Imaging: Ct Head Wo Contrast  02/25/2014   CLINICAL DATA:  Code stroke.  Altered mental status.  EXAM: CT HEAD WITHOUT CONTRAST  TECHNIQUE: Contiguous axial images were obtained from the base of the skull through the vertex without intravenous contrast.  COMPARISON:  None.  FINDINGS: Extensive age advanced chronic microvascular changes throughout the deep white matter. Areas of low density noted within the pons, likely old infarcts. No acute infarction. No hemorrhage or hydrocephalus. No acute calvarial abnormality.  IMPRESSION: Age advanced chronic microvascular changes throughout the deep white matter.  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  Critical Value/emergent results were called by telephone at the time of interpretation on 03/11/2014 at 10:00 am to Dr. Thad Ranger , who verbally acknowledged these results.   Electronically Signed   By: Charlett Nose M.D.   On: 03/12/2014 10:01    Assessment: 63 y.o. male presenting with active GI bleeding and complaints of dizziness, left sided weakness and confusion.  Patient now appears at baseline.  Head CT reviewed and shows no acute changes but advanced small vessel disease.  Can not rule  out TIA.  Due to resolution of symptoms and GI bleeding patient is not a tPA candidate.  If symptoms recur is still not a tPA candidate but will review vasculature to determine if intervention may be an option if that were to occur.    Stroke Risk Factors - diabetes mellitus, hyperlipidemia and hypertension  Plan: 1. HgbA1c, fasting lipid panel 2. MRI of the brain without contrast 3. PT consult, OT consult, Speech consult 4. Echocardiogram 5. CTA of head and neck 6. Prophylactic therapy-Due to GI bleeding, none at this time 7. Risk factor modification 8. Telemetry monitoring 9. Frequent neuro checks  Case discussed with Dr. Barbette OrSteinl  Hila Bolding, MD [facility] [phone] 02/16/2014, 10:34  AM

## 2014-02-26 NOTE — ED Provider Notes (Addendum)
CSN: [id number]     Arrival date & time 03/03/2014  [id number] History   First MD Initiated Contact with Patient 03/11/2014 [phone]     Chief Complaint  Patient presents with  . Code Stroke    An emergency department physician performed an initial assessment on this suspected stroke patient at [phone]. (Consider location/radiation/quality/duration/timing/severity/associated sxs/prior Treatment) The history is provided by the patient.  pt with hx htn, diabetes, c/o feeling weak and dizzy this morning.  Hx from ems and pt.  ems notes pts spouse spoke w him at 0530 and seemed normal. Daughter spoke w pt at 0830 at which time he seemed weak, altered, 'not making sense'.   Pt states he feels generally weak, and faint/dizzy when standing. States early in AM had 2 bms, first w blood mixed w stool, second w red blood. No recent melena. No hx gi bleeding. No hx pud.  No anticoagulant use. Single 81 mg asa a day. Denies other nsaid use.  No abd pain, although felt urge to have bm earlier. No other recent blood loss.   Past Medical History  Diagnosis Date  . Diabetes mellitus without complication   . Hypertension   . Allergic rhinitis   . Hyperlipidemia due to type 1 diabetes mellitus    History reviewed. No pertinent past surgical history. No family history on file. History  Substance Use Topics  . Smoking status: Not on file  . Smokeless tobacco: Not on file  . Alcohol Use: Not on file    Review of Systems  Constitutional: Negative for fever and chills.  HENT: Negative for sore throat.   Eyes: Negative for redness and visual disturbance.  Respiratory: Negative for shortness of breath.   Cardiovascular: Negative for chest pain and leg swelling.  Gastrointestinal: Positive for blood in stool. Negative for vomiting and abdominal pain.  Genitourinary: Negative for dysuria, hematuria and flank pain.  Musculoskeletal: Negative for back pain and neck pain.  Skin: Negative for rash.  Neurological: Negative  for numbness and headaches.  Hematological: Does not bruise/bleed easily.  Psychiatric/Behavioral: Negative for confusion.      Allergies  Review of patient's allergies indicates no known allergies.  Home Medications   Prior to Admission medications   Medication Sig Start Date End Date Taking? Authorizing Provider  aspirin EC 81 MG tablet Take 81 mg by mouth daily.   Yes Historical Provider, MD  Canagliflozin (INVOKANA) 300 MG TABS Take 300 mg by mouth daily.   Yes Historical Provider, MD  lisinopril (PRINIVIL,ZESTRIL) 40 MG tablet Take 40 mg by mouth daily.   Yes Historical Provider, MD  metFORMIN (GLUCOPHAGE-XR) 500 MG 24 hr tablet Take 500 mg by mouth daily with breakfast.   Yes Historical Provider, MD  Omega-3 Fatty Acids (FISH OIL PO) Take 1 tablet by mouth daily.   Yes Historical Provider, MD  OVER THE COUNTER MEDICATION Take 1 tablet by mouth daily. Cut   Yes Historical Provider, MD   BP 104/59  Pulse 107  Temp(Src) 98 F (36.7 C) (Oral)  Resp 14  SpO2 100% Physical Exam  Nursing note and vitals reviewed. Constitutional: He is oriented to person, place, and time. He appears well-developed.  Pale appearing.   HENT:  Head: Atraumatic.  Mouth/Throat: Oropharynx is clear and moist.  Eyes: Conjunctivae are normal. Pupils are equal, round, and reactive to light.  Neck: Normal range of motion. Neck supple. No tracheal deviation present.  Cardiovascular: Normal rate, regular rhythm, normal heart sounds and intact distal pulses.  Pulmonary/Chest: Effort normal and breath sounds normal. No accessory muscle usage. No respiratory distress.  Abdominal: Soft. Bowel sounds are normal. He exhibits no distension and no mass. There is no tenderness. There is no rebound and no guarding.  Genitourinary:  No stool in rectum, small amt red blood, heme pos.   Musculoskeletal: Normal range of motion. He exhibits no edema and no tenderness.  Neurological: He is alert and oriented to person,  place, and time. No cranial nerve deficit.  Motor intact bil. stre 5/5.    Skin: Skin is warm and dry. No rash noted. There is pallor.  Psychiatric: He has a normal mood and affect.    ED Course  Procedures (including critical care time) Labs Review   Results for orders placed during the hospital encounter of 03/17/2014  ETHANOL      Result Value Ref Range   Alcohol, Ethyl (B) <11  0 - 11 mg/dL  PROTIME-INR      Result Value Ref Range   Prothrombin Time 14.7  11.6 - 15.2 seconds   INR 1.15  0.00 - 1.49  APTT      Result Value Ref Range   aPTT 25  24 - 37 seconds  CBC      Result Value Ref Range   WBC 13.2 (*) 4.0 - 10.5 K/uL   RBC 3.42 (*) 4.22 - 5.81 MIL/uL   Hemoglobin 10.5 (*) 13.0 - 17.0 g/dL   HCT 11.9 (*) 14.7 - 82.9 %   MCV 89.5  78.0 - 100.0 fL   MCH 30.7  26.0 - 34.0 pg   MCHC 34.3  30.0 - 36.0 g/dL   RDW 56.2  13.0 - 86.5 %   Platelets 236  150 - 400 K/uL  DIFFERENTIAL      Result Value Ref Range   Neutrophils Relative % 77  43 - 77 %   Neutro Abs 10.2 (*) 1.7 - 7.7 K/uL   Lymphocytes Relative 19  12 - 46 %   Lymphs Abs 2.5  0.7 - 4.0 K/uL   Monocytes Relative 3  3 - 12 %   Monocytes Absolute 0.4  0.1 - 1.0 K/uL   Eosinophils Relative 1  0 - 5 %   Eosinophils Absolute 0.1  0.0 - 0.7 K/uL   Basophils Relative 0  0 - 1 %   Basophils Absolute 0.0  0.0 - 0.1 K/uL  COMPREHENSIVE METABOLIC PANEL      Result Value Ref Range   Sodium 141  137 - 147 mEq/L   Potassium 4.8  3.7 - 5.3 mEq/L   Chloride 106  96 - 112 mEq/L   CO2 19  19 - 32 mEq/L   Glucose, Bld 323 (*) 70 - 99 mg/dL   BUN 31 (*) 6 - 23 mg/dL   Creatinine, Ser 7.84  0.50 - 1.35 mg/dL   Calcium 8.7  8.4 - 69.6 mg/dL   Total Protein 6.0  6.0 - 8.3 g/dL   Albumin 3.0 (*) 3.5 - 5.2 g/dL   AST 59 (*) 0 - 37 U/L   ALT 66 (*) 0 - 53 U/L   Alkaline Phosphatase 70  39 - 117 U/L   Total Bilirubin 0.6  0.3 - 1.2 mg/dL   GFR calc non Af Amer 58 (*) >90 mL/min   GFR calc Af Amer 67 (*) >90 mL/min   Anion  gap 16 (*) 5 - 15  I-STAT CHEM 8, ED      Result Value  Ref Range   Sodium 141  137 - 147 mEq/L   Potassium 4.7  3.7 - 5.3 mEq/L   Chloride 109  96 - 112 mEq/L   BUN 29 (*) 6 - 23 mg/dL   Creatinine, Ser 1.61  0.50 - 1.35 mg/dL   Glucose, Bld 096 (*) 70 - 99 mg/dL   Calcium, Ion 0.45  4.09 - 1.30 mmol/L   TCO2 18  0 - 100 mmol/L   Hemoglobin 10.5 (*) 13.0 - 17.0 g/dL   HCT 81.1 (*) 91.4 - 78.2 %  I-STAT TROPOININ, ED      Result Value Ref Range   Troponin i, poc 0.01  0.00 - 0.08 ng/mL   Comment 3           CBG MONITORING, ED      Result Value Ref Range   Glucose-Capillary 293 (*) 70 - 99 mg/dL  POC OCCULT BLOOD, ED      Result Value Ref Range   Fecal Occult Bld POSITIVE (*) NEGATIVE  TYPE AND SCREEN      Result Value Ref Range   ABO/RH(D) AB POS     Antibody Screen NEG     Sample Expiration 03/10/2014     Ct Head Wo Contrast  03/06/2014   CLINICAL DATA:  Code stroke.  Altered mental status.  EXAM: CT HEAD WITHOUT CONTRAST  TECHNIQUE: Contiguous axial images were obtained from the base of the skull through the vertex without intravenous contrast.  COMPARISON:  None.  FINDINGS: Extensive age advanced chronic microvascular changes throughout the deep white matter. Areas of low density noted within the pons, likely old infarcts. No acute infarction. No hemorrhage or hydrocephalus. No acute calvarial abnormality.  IMPRESSION: Age advanced chronic microvascular changes throughout the deep white matter.  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  Critical Value/emergent results were called by telephone at the time of interpretation on 03/03/2014 at 10:00 am to Dr. Thad Ranger , who verbally acknowledged these results.   Electronically Signed   By: Charlett Nose M.D.   On: 02/21/2014 10:01     Imaging Review Ct Head Wo Contrast  02/20/2014   CLINICAL DATA:  Code stroke.  Altered mental status.  EXAM: CT HEAD WITHOUT CONTRAST  TECHNIQUE: Contiguous axial images were obtained from the base of the skull  through the vertex without intravenous contrast.  COMPARISON:  None.  FINDINGS: Extensive age advanced chronic microvascular changes throughout the deep white matter. Areas of low density noted within the pons, likely old infarcts. No acute infarction. No hemorrhage or hydrocephalus. No acute calvarial abnormality.  IMPRESSION: Age advanced chronic microvascular changes throughout the deep white matter.  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  Critical Value/emergent results were called by telephone at the time of interpretation on 03/03/2014 at 10:00 am to Dr. Thad Ranger , who verbally acknowledged these results.   Electronically Signed   By: Charlett Nose M.D.   On: 03/09/2014 10:01     EKG Interpretation   Date/Time:  Monday February 26 2014 09:57:48 EDT Ventricular Rate:  104 PR Interval:  136 QRS Duration: 94 QT Interval:  366 QTC Calculation: 481 R Axis:   79 Text Interpretation:  Sinus tachycardia Probable left atrial enlargement  Probable left ventricular hypertrophy Borderline prolonged QT interval No  previous tracing Confirmed by Kada Friesen  MD, Caryn Bee ([id number]) on 03/04/2014  10:32:06 AM      MDM  In ns, continuous pulse ox and monitor. o2 [place]. Labs. Ecg.   Pt had been made a code  stroke by ems prior to arrival.  Neurology did eval in ed.  For low bp, gi bleeding, initial labs and ns bolus. Second iv line established.  Type and screen sent.  protonix 80 mg iv.    Recheck abd soft nt.   Gi consulted.   Med service contacted for admission.  Reviewed nursing notes and prior charts for additional history.   CRITICAL CARE  Re hypotension, GI bleeding, weakness, dizziness. Performed by: Suzi RootsSTEINL,[place] Lebeau E Total critical care time: 35 Critical care time was exclusive of separately billable procedures and treating other patients. Critical care was necessary to treat or prevent imminent or life-threatening deterioration. Critical care was time spent personally by me on the following activities:  development of treatment plan with patient and/or surrogate as well as nursing, discussions with consultants, evaluation of patient's response to treatment, examination of patient, obtaining history from patient or surrogate, ordering and performing treatments and interventions, ordering and review of laboratory studies, ordering and review of radiographic studies, pulse oximetry and re-evaluation of patient's condition.  Discussed pt with gi on call, Dr Matthias HughsBuccini, including hgb 10, bun, blood per rectum, and persistent soft bp after ns boluses - he will see/consult, and consider emergent egd.  Discussed pt with hospitalist, Dr Benjamine MolaVann, including vitals, labs - she indicates do temp orders to stepdown, she will see.  Recheck pt, bp 95/50, hr 102, abd soft nt.  Updated on plan.  Given persistent low bp after ns boluses, will transfuse 2 units prbc.      Suzi RootsKevin E Telena Peyser, MD 02/21/2014 [phone]

## 2014-02-26 NOTE — Op Note (Signed)
[facility] [address] [place], [id number]   COLONOSCOPY PROCEDURE REPORT  PATIENT: Arthur Lawrence, Arthur Lawrence  MR#: [id number] BIRTHDATE: [birth year] , 63  yrs. old GENDER: male ENDOSCOPIST: Bernette Redbirdobert Oaklie Durrett, MD REFERRED BY:  Maryelizabeth RowanElizabeth Dewey, M.D. PROCEDURE DATE:  11-15-2013 PROCEDURE:   Colonoscopy, diagnostic ASA CLASS:   Class III INDICATIONS:hematochezia with anemia. MEDICATIONS: Versed 2 mg IV  DESCRIPTION OF PROCEDURE:   After the risks and benefits and of the procedure were explained, informed consent was obtained.  digital rectal exam revealed no prostatic nodules.    The [id number] ([id number]) and Pentax Ped Colon [id number]  endoscope was introduced through the anus and advanced to the transverse colon and terminal ileum, respectively .  The quality of the prep was adequate. .  The instrument was then slowly withdrawn as the colon was fully examined.     COLON FINDINGS: The procedure was initiated with the endoscope which had been used for the patient's upper endoscopy.  No additional sedation beyond that which had been administered for the upper endoscopy was needed for this exam, and the patient remained stable throughout the procedure.  The endoscope was advanced to approximately the region of the transverse colon, but there was still a lot of blood and no definitive diagnosis, so I elected to remove that scope and used the pediatric colonoscope, which was successfully advanced to the terminal ileum.  The terminal ileum was free of blood.  However, there was blood throughout the lumen of the colon.  Several diverticula were noted in the proximal colon, in the ascending colon.  Near the hepatic flexure was a very large, adherent clot which would not irrigate off.  No fresh or active bleeding was noted upon inspecting the site for several minutes and irrigating with water. I placed a clip at this location in the event that the patient needed  angiographic treatment.  The remainder of the colon was grossly normal, without obvious mucosal abnormalities, polyps, or masses.  However, because of the large amount of blood and moderate amount of residual clot scattered throughout the colonic lumen, it is certainly possible that small or focal lesions might have been missed.  Retroflexion was not performed in the rectum.       The scope was then withdrawn from the patient and the procedure completed.  WITHDRAWAL TIME:  COMPLICATIONS: There were no immediate complications. ENDOSCOPIC IMPRESSION: The procedure was initiated with the endoscope which had been used for the patient's upper endoscopy.  No additional sedation beyond that which had been administered for the upper endoscopy was needed for this exam, and the patient remained stable throughout the procedure.  The endoscope was advanced to approximately the region of the transverse colon, but there was still a lot of blood and no definitive diagnosis, so I elected to remove that scope and used the pediatric colonoscope, which was successfully advanced to the terminal ileum.  The terminal ileum was free of blood.  However, there was blood throughout the lumen of the colon.  Several diverticula were noted in the proximal colon, in the ascending colon.  Near the hepatic flexure was a very large, adherent clot which would not irrigate off.  No fresh or active bleeding was noted upon inspecting the site for several minutes and irrigating with water. I placed a clip at this location in the event that the patient needed angiographic treatment.  The remainder of the colon was grossly normal, without obvious mucosal abnormalities, polyps, or masses.  However, because of the large amount of blood and moderate amount of residual clot scattered throughout the colonic lumen, it is certainly possible that small or focal lesions might have been missed.  Retroflexion was not  performed in the rectum  impression:  1. Large amount of blood throughout the colon, with no blood in the terminal ileum, strongly suggesting a colonic origin of bleeding 2. No active bleeding identified at the present time 3. Large adherent clot near the hepatic flexure, marked with a clip, and felt to be the likely location of the patient's bleeding 4. Multiple proximal colonic diverticula, without other pathology identified on this unprepped exam. Suspect that this is a diverticular hemorrhage, originating from the region of the hepatic flexure   RECOMMENDATIONS: Medical supportive care with frequent monitoring of hemoglobin status.  In the event of a destabilizing rebleed, I would consider consultation with vascular radiology.  Because the presumed site of bleeding has been marked with a clip, it might be possible to skip the step of doing a bleeding scan.  The other option would be surgical consultation and resection.  It appears that this patient's diverticulosis is confined to the proximal colon.  REPEAT EXAM:  ZO:XWRUEAVWUcc:[name] Duanne Guessewey, MD  _______________________________ eSignedBernette Redbird:  Abbegale Stehle, MD 2013/12/24 4:42 PM   CPT CODES: ICD CODES:  The ICD and CPT codes recommended by this software are interpretations from the data that the clinical staff has captured with the software.  The verification of the translation of this report to the ICD and CPT codes and modifiers is the sole responsibility of the health care institution and practicing physician where this report was generated.  [employer] will not be held responsible for the validity of the ICD and CPT codes included on this report.  AMA assumes no liability for data contained or not contained herein. CPT is a Publishing rights managerregistered trademark of the [employer].   PATIENT NAME:  Arthur Lawrence, Arthur Lawrence MR#: [id number]

## 2014-02-26 NOTE — ED Notes (Signed)
Pt bib [facility] for eval of stroke. Pt was in bed this am and spoken to by wife at 0530, speech and comprehension wnl at that time. Per pt's daughter she heard what she thought was a fall at 0730, but pt called out from bathroom that he dropped deodorizer. Pt's daughter spoke to pt at [id number] and pt was not making sense. Per ems pt's gait is 'off', pt leaning to left. Weaker on left.

## 2014-02-26 NOTE — Op Note (Signed)
[facility] [address] [place], [id number]   ENDOSCOPY PROCEDURE REPORT  PATIENT: Arthur Lawrence, Arthur Lawrence  MR#[name]: [id number] BIRTHDATE: [birth year] , 63  yrs. old GENDER: male ENDOSCOPIST:Niaja Stickley, MD REFERRED BY: Dr. Maryelizabeth RowanElizabeth Dewey PROCEDURE DATE:  02/19/2014 PROCEDURE: ASA CLASS:    Class III INDICATIONS: hematochezia with anemia MEDICATION: Versed 2 mg IV TOPICAL ANESTHETIC:   Cetacaine Spray  DESCRIPTION OF PROCEDURE:   After the risks and benefits of the procedure were explained, informed consent was obtained.  The Pentax Gastroscope [id number]  endoscope was introduced through the mouth  and advanced to the second portion of the duodenum .  The instrument was slowly withdrawn as the mucosa was fully examined.    This was a normal examination.The vocal cords looked normal. The esophagus was normal, without reflux esophagitis, varices, infection, Mallory-Weiss tear, neoplasia, Barrett's esophagus, or reflux esophagitis.          retroflexion showed a normal cardia and fundus.         The scope was then withdrawn from the patient and the procedure completed.  no biopsies were obtained.  COMPLICATIONS: There were no immediate complications.  ENDOSCOPIC IMPRESSION: normal endoscopy, no source of bleeding identified RECOMMENDATIONS: proceed to sigmoidoscopic evaluation   _______________________________ eSignedBernette Redbird:  Isa Kohlenberg, MD 03/16/2014 4:27 PM     cc: Dr. Maryelizabeth RowanElizabeth Dewey  CPT CODES: ICD CODES:  The ICD and CPT codes recommended by this software are interpretations from the data that the clinical staff has captured with the software.  The verification of the translation of this report to the ICD and CPT codes and modifiers is the sole responsibility of the health care institution and practicing physician where this report was generated.  [employer] will not be held responsible for the validity of the ICD and CPT  codes included on this report.  AMA assumes no liability for data contained or not contained herein. CPT is a Publishing rights managerregistered trademark of the [employer].  PATIENT NAME:  Arthur Lawrence, Arthur Lawrence MR#: [id number]

## 2014-02-26 NOTE — H&P (Signed)
[facility] History and Physical  FEDOR KAZMIERSKI JXB:[id number] DOB: 1950-08-06 DOA: 03/12/2014  Referring physician: er PCP: Maryelizabeth Rowan, MD   Chief Complaint: GI bleed  HPI: Arthur Lawrence is a 63 y.o. male  Who was initially brought in as a code stroke.  Patient states he got up to use the bathroom and noticed BRB in the toilet.  When he got up from the toilet, he felt weak all over. He did not fall.  Family reports that patient was leaning toward the left and speech was nonsensical.  In the ER, patient had several more bouts of bloody stools and a near-syncopal event when he got up to the bedside commode.   Patient has been in his normal state of health up to this AM.  He works out daily and is retired.  He is on daily ASA.    Last colonoscopy was about 2007 per patient and he had 1 polyp removed.  Hgb in 2013 was 15- now 10.5- no other recent baseline, in er, liver enzymes were mildly elevated and blood sugar was elevated  Review of Systems:  All systems reviewed, negative unless stated above   Past Medical History  Diagnosis Date  . Diabetes mellitus without complication   . Hypertension   . Allergic rhinitis   . Hyperlipidemia due to type 1 diabetes mellitus    History reviewed. No pertinent past surgical history. Social History:  reports that he has never smoked. He does not have any smokeless tobacco history on file. His alcohol and drug histories are not on file. he is retired  No Known Allergies  No family history on file.   Prior to Admission medications   Medication Sig Start Date End Date Taking? Authorizing Provider  aspirin EC 81 MG tablet Take 81 mg by mouth daily.   Yes Historical Provider, MD  Canagliflozin (INVOKANA) 300 MG TABS Take 300 mg by mouth daily.   Yes Historical Provider, MD  lisinopril (PRINIVIL,ZESTRIL) 40 MG tablet Take 40 mg by mouth daily.   Yes Historical Provider, MD  metFORMIN (GLUCOPHAGE-XR) 500 MG 24 hr tablet Take 500 mg by  mouth daily with breakfast.   Yes Historical Provider, MD  Omega-3 Fatty Acids (FISH OIL PO) Take 1 tablet by mouth daily.   Yes Historical Provider, MD  OVER THE COUNTER MEDICATION Take 1 tablet by mouth daily. Cut   Yes Historical Provider, MD   Physical Exam: Filed Vitals:   2014/03/12 1327 12-Mar-2014 1330 03-12-14 1345 2014-03-12 1400  BP: 93/56 88/52 78/51  78/53  Pulse:  100 98 104  Temp: 98 F (36.7 C)   98 F (36.7 C)  TempSrc: Oral   Oral  Resp:  20 19 18   SpO2:  100% 100% 100%    Wt Readings from Last 3 Encounters:  No data found for Wt    General:  Appears calm and comfortable Eyes: PERRL, normal lids, irises & conjunctiva ENT: grossly normal hearing, lips & tongue Neck: no LAD, masses or thyromegaly Cardiovascular: RRR, no m/r/g. No LE edema. Respiratory: CTA bilaterally, no w/r/r. Normal respiratory effort. Abdomen: soft, ntnd Skin: no rash or induration seen on limited exam Musculoskeletal: grossly normal tone BUE/BLE Psychiatric: grossly normal mood and affect, speech fluent and appropriate Neurologic: grossly non-focal.          Labs on Admission:  Basic Metabolic Panel:  Recent Labs Lab 2014-03-12 0934 12-Mar-2014 0953  NA 141 141  K 4.8 4.7  CL 106 109  CO2 19  --  GLUCOSE 323* 323*  BUN 31* 29*  CREATININE 1.28 1.30  CALCIUM 8.7  --    Liver Function Tests:  Recent Labs Lab 03/17/2014 0934  AST 59*  ALT 66*  ALKPHOS 70  BILITOT 0.6  PROT 6.0  ALBUMIN 3.0*   No results found for this basename: LIPASE, AMYLASE,  in the last 168 hours No results found for this basename: AMMONIA,  in the last 168 hours CBC:  Recent Labs Lab 03/10/2014 0934 02/25/2014 0953  WBC 13.2*  --   NEUTROABS 10.2*  --   HGB 10.5* 10.5*  HCT 30.6* 31.0*  MCV 89.5  --   PLT 236  --    Cardiac Enzymes: No results found for this basename: CKTOTAL, CKMB, CKMBINDEX, TROPONINI,  in the last 168 hours  BNP (last 3 results) No results found for this basename: PROBNP,  in  the last 8760 hours CBG:  Recent Labs Lab 03/03/2014 1002  GLUCAP 293*    Radiological Exams on Admission: Ct Angio Head W/cm &/or Wo Cm  02/18/2014   CLINICAL DATA:  Acute onset of dizziness and left-sided weakness. Confusion.  EXAM: CT ANGIOGRAPHY HEAD AND NECK  TECHNIQUE: Multidetector CT imaging of the head and neck was performed using the standard protocol during bolus administration of intravenous contrast. Multiplanar CT image reconstructions and MIPs were obtained to evaluate the vascular anatomy. Carotid stenosis measurements (when applicable) are obtained utilizing NASCET criteria, using the distal internal carotid diameter as the denominator.  CONTRAST:  80mL OMNIPAQUE IOHEXOL 350 MG/ML SOLN  COMPARISON:  CT without contrast.  FINDINGS: CTA HEAD FINDINGS  The source images demonstrate normal gray-white differentiation the cortex and basal ganglia. No acute cortical infarct is evident. Focal hypoattenuation is noted within the brainstem and brainstem infarct is not excluded. This finding is consistent across the pre and postcontrast images.  Atherosclerotic calcifications are present within the cavernous carotid arteries without significant stenoses relative to the more distal vessels. The ICA terminus is intact bilaterally. The A1 and M1 segments are normal. No significant anterior communicating artery is evident. The the MCA bifurcations are intact. The ACA and MCA branch vessels are within normal limits.  As stated, there is focal atherosclerotic calcification involving the dural margin of the left vertebral artery without a significant stenosis. The PICA origins are visualized and normal. The basilar artery is within normal limits. Both posterior cerebral arteries originate from the basilar tip. The PCA branch vessels are unremarkable.  The dural sinuses are patent.  Review of the MIP images confirms the above findings.  CTA NECK FINDINGS  The aortic arch is incompletely evaluated. The  vertebral arteries originate from the subclavian arteries. The left vertebral artery is dominant. Atherosclerotic changes are present at the origins of both vertebral arteries. A moderate to high-grade stenosis is present on the right. No additional stenoses are evident. The vertebral arteries are codominant at the level of the dural margin. Vascular calcifications are present in the left vertebral artery at the foramen magnum.  The right common carotid artery is within normal limits. Minimal atherosclerotic calcifications are present in the proximal right ICA. There is no significant stenosis.  The left common carotid artery is within normal limits. The bifurcation demonstrates minimal atherosclerotic calcification. There is no significant stenosis. Cervical ICA is normal.  The soft tissues of the neck are unremarkable.  Review of the MIP images confirms the above findings.  IMPRESSION: 1. Age indeterminate infarcts involving the brainstem. 2. No significant supratentorial infarct. 3. 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 The vertebral arteries are codominant distally. 4. Minimal atherosclerotic changes at the carotid bifurcations bilaterally without significant stenosis. 5. Atherosclerotic changes within the cavernous carotid arteries bilaterally without significant stenosis. 6. Mild distal small vessel disease. These results were called by telephone at the time of interpretation on June 13, 2013 at 12:15 pm to Dr. Thana FarrLESLIE REYNOLDS , who verbally acknowledged these results.   Electronically Signed   By: Gennette Pachris  Mattern M.D.   On: June 13, 2013 12:15   Ct Head Wo Contrast  June 13, 2013   CLINICAL DATA:  Code stroke.  Altered mental status.  EXAM: CT HEAD WITHOUT CONTRAST  TECHNIQUE: Contiguous axial images were obtained from the base of the skull through the vertex without intravenous contrast.  COMPARISON:  None.  FINDINGS: Extensive age advanced chronic microvascular changes throughout  the deep white matter. Areas of low density noted within the pons, likely old infarcts. No acute infarction. No hemorrhage or hydrocephalus. No acute calvarial abnormality.  IMPRESSION: Age advanced chronic microvascular changes throughout the deep white matter.  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  Critical Value/emergent results were called by telephone at the time of interpretation on June 13, 2013 at 10:00 am to Dr. Thad Rangereynolds , who verbally acknowledged these results.   Electronically Signed   By: Charlett NoseKevin  Dover M.D.   On: June 13, 2013 10:01   Ct Angio Neck W/cm &/or Wo/cm  June 13, 2013   CLINICAL DATA:  Acute onset of dizziness and left-sided weakness. Confusion.  EXAM: CT ANGIOGRAPHY HEAD AND NECK  TECHNIQUE: Multidetector CT imaging of the head and neck was performed using the standard protocol during bolus administration of intravenous contrast. Multiplanar CT image reconstructions and MIPs were obtained to evaluate the vascular anatomy. Carotid stenosis measurements (when applicable) are obtained utilizing NASCET criteria, using the distal internal carotid diameter as the denominator.  CONTRAST:  80mL OMNIPAQUE IOHEXOL 350 MG/ML SOLN  COMPARISON:  CT without contrast.  FINDINGS: CTA HEAD FINDINGS  The source images demonstrate normal gray-white differentiation the cortex and basal ganglia. No acute cortical infarct is evident. Focal hypoattenuation is noted within the brainstem and brainstem infarct is not excluded. This finding is consistent across the pre and postcontrast images.  Atherosclerotic calcifications are present within the cavernous carotid arteries without significant stenoses relative to the more distal vessels. The ICA terminus is intact bilaterally. The A1 and M1 segments are normal. No significant anterior communicating artery is evident. The the MCA bifurcations are intact. The ACA and MCA branch vessels are within normal limits.  As stated, there is focal atherosclerotic calcification involving the  dural margin of the left vertebral artery without a significant stenosis. The PICA origins are visualized and normal. The basilar artery is within normal limits. Both posterior cerebral arteries originate from the basilar tip. The PCA branch vessels are unremarkable.  The dural sinuses are patent.  Review of the MIP images confirms the above findings.  CTA NECK FINDINGS  The aortic arch is incompletely evaluated. The vertebral arteries originate from the subclavian arteries. The left vertebral artery is dominant. Atherosclerotic changes are present at the origins of both vertebral arteries. A moderate to high-grade stenosis is present on the right. No additional stenoses are evident. The vertebral arteries are codominant at the level of the dural margin. Vascular calcifications are present in the left vertebral artery at the foramen magnum.  The right common carotid artery is within normal limits. Minimal atherosclerotic calcifications are present in the proximal right ICA. There is no significant stenosis.  The left common carotid artery is within normal limits. The bifurcation demonstrates minimal atherosclerotic calcification. There is no significant stenosis. Cervical ICA is normal.  The soft tissues of the neck are unremarkable.  Review of the MIP images confirms the above findings.  IMPRESSION: 1. Age indeterminate infarcts involving the brainstem. 2. No significant supratentorial infarct. 3. 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 The vertebral arteries are codominant distally. 4. Minimal atherosclerotic changes at the carotid bifurcations bilaterally without significant stenosis. 5. Atherosclerotic changes within the cavernous carotid arteries bilaterally without significant stenosis. 6. Mild distal small vessel disease. These results were called by telephone at the time of interpretation on 02/17/2014 at 12:15 pm to Dr. Thana Farr , who verbally acknowledged these  results.   Electronically Signed   By: Gennette Pac M.D.   On: 02/16/2014 12:15    EKG: Independently reviewed. Sinus tachy  Assessment/Plan Active Problems:   GI bleeding   DM (diabetes mellitus)   HTN (hypertension)  Hypotension- monitor in SDU, IVF and transfuse 2 units PRBCs- monitor H/H and may need further transfusion  ABLA/GI bleed- monitor h/h, getting 2 units PRBC, GI to see and do upper and lower endoscopy  DM- holding PO meds, SSI  HTN- holding BP Meds as NPO, monitor  Mild elevation of LFTs   Code Status: full DVT Prophylaxis: Family Communication: patient Disposition Plan:  Time spent: 65 min  Marlin Canary [facility] Pager [phone]

## 2014-02-26 NOTE — Progress Notes (Signed)
Endoscopy and colonoscopy were well tolerated.  He appears to have had a diverticular bleed. He had a large adherent clot, with multiple diverticula, in the proximal colon, without other abnormalities identified in with no blood in the terminal ileum.   He did not appear to have active bleeding at the time of this exam, but he did have a large amount of residual blood in the colon so we can expect multiple more more bloody bowel movements over the next 24 hours, even if he does not have actual recurrent bleeding.  I would consider interventional radiology and/or surgical consultation if he has further destabilizing bleeding.  Florencia Reasonsobert V. Deserae Jennings, M.D. [phone]

## 2014-02-26 NOTE — Progress Notes (Signed)
NP made aware that pt is still having bright red blood from rectum. Very large amount & third time today. 2 unit of blood complete, Order for H & H placed at 10pm. Np Schorr stated to monitor pt for now.

## 2014-02-27 ENCOUNTER — Inpatient Hospital Stay (HOSPITAL_COMMUNITY): Payer: BC Managed Care – PPO

## 2014-02-27 ENCOUNTER — Encounter (HOSPITAL_COMMUNITY): Payer: Self-pay | Admitting: Radiology

## 2014-02-27 DIAGNOSIS — I6340 Cerebral infarction due to embolism of unspecified cerebral artery: Secondary | ICD-10-CM

## 2014-02-27 DIAGNOSIS — I519 Heart disease, unspecified: Secondary | ICD-10-CM

## 2014-02-27 DIAGNOSIS — R400 Somnolence: Secondary | ICD-10-CM

## 2014-02-27 DIAGNOSIS — E118 Type 2 diabetes mellitus with unspecified complications: Secondary | ICD-10-CM

## 2014-02-27 DIAGNOSIS — R4182 Altered mental status, unspecified: Secondary | ICD-10-CM | POA: Diagnosis present

## 2014-02-27 LAB — HEMOGLOBIN
HEMOGLOBIN: 9.3 g/dL — AB (ref 13.0–17.0)
HEMOGLOBIN: 9.4 g/dL — AB (ref 13.0–17.0)

## 2014-02-27 LAB — COMPREHENSIVE METABOLIC PANEL WITH GFR
ALT: 44 U/L (ref 0–53)
AST: 39 U/L — ABNORMAL HIGH (ref 0–37)
Albumin: 2.2 g/dL — ABNORMAL LOW (ref 3.5–5.2)
Alkaline Phosphatase: 42 U/L (ref 39–117)
Anion gap: 17 — ABNORMAL HIGH (ref 5–15)
BUN: 39 mg/dL — ABNORMAL HIGH (ref 6–23)
CO2: 13 meq/L — ABNORMAL LOW (ref 19–32)
Calcium: 7 mg/dL — ABNORMAL LOW (ref 8.4–10.5)
Chloride: 112 meq/L (ref 96–112)
Creatinine, Ser: 1.86 mg/dL — ABNORMAL HIGH (ref 0.50–1.35)
GFR calc Af Amer: 43 mL/min — ABNORMAL LOW
GFR calc non Af Amer: 37 mL/min — ABNORMAL LOW
Glucose, Bld: 316 mg/dL — ABNORMAL HIGH (ref 70–99)
Potassium: 4.9 meq/L (ref 3.7–5.3)
Sodium: 142 meq/L (ref 137–147)
Total Bilirubin: 1 mg/dL (ref 0.3–1.2)
Total Protein: 4.3 g/dL — ABNORMAL LOW (ref 6.0–8.3)

## 2014-02-27 LAB — CBC
HCT: 25.8 % — ABNORMAL LOW (ref 39.0–52.0)
HCT: 28 % — ABNORMAL LOW (ref 39.0–52.0)
Hemoglobin: 9.3 g/dL — ABNORMAL LOW (ref 13.0–17.0)
Hemoglobin: 10.1 g/dL — ABNORMAL LOW (ref 13.0–17.0)
MCH: 31.7 pg (ref 26.0–34.0)
MCH: 30.7 pg (ref 26.0–34.0)
MCHC: 36 g/dL (ref 30.0–36.0)
MCHC: 36.1 g/dL — ABNORMAL HIGH (ref 30.0–36.0)
MCV: 88.1 fL (ref 78.0–100.0)
MCV: 85.1 fL (ref 78.0–100.0)
Platelets: 125 K/uL — ABNORMAL LOW (ref 150–400)
Platelets: 99 K/uL — ABNORMAL LOW (ref 150–400)
RBC: 2.93 MIL/uL — ABNORMAL LOW (ref 4.22–5.81)
RBC: 3.29 MIL/uL — ABNORMAL LOW (ref 4.22–5.81)
RDW: 13.6 % (ref 11.5–15.5)
RDW: 13.7 % (ref 11.5–15.5)
WBC: 15.3 K/uL — ABNORMAL HIGH (ref 4.0–10.5)
WBC: 18.1 K/uL — ABNORMAL HIGH (ref 4.0–10.5)

## 2014-02-27 LAB — ACETAMINOPHEN LEVEL: Acetaminophen (Tylenol), Serum: <15 ug/mL (ref 10–30)

## 2014-02-27 LAB — GLUCOSE, CAPILLARY
GLUCOSE-CAPILLARY: 290 mg/dL — AB (ref 70–99)
GLUCOSE-CAPILLARY: 207 mg/dL — AB (ref 70–99)
GLUCOSE-CAPILLARY: 148 mg/dL — AB (ref 70–99)
GLUCOSE-CAPILLARY: 131 mg/dL — AB (ref 70–99)
GLUCOSE-CAPILLARY: 124 mg/dL — AB (ref 70–99)
Glucose-Capillary: 295 mg/dL — ABNORMAL HIGH (ref 70–99)
Glucose-Capillary: 198 mg/dL — ABNORMAL HIGH (ref 70–99)
Glucose-Capillary: 159 mg/dL — ABNORMAL HIGH (ref 70–99)
Glucose-Capillary: 134 mg/dL — ABNORMAL HIGH (ref 70–99)
Glucose-Capillary: 124 mg/dL — ABNORMAL HIGH (ref 70–99)

## 2014-02-27 LAB — BLOOD GAS, ARTERIAL
Acid-base deficit: 13.4 mmol/L — ABNORMAL HIGH (ref 0.0–2.0)
Acid-base deficit: 9.8 mmol/L — ABNORMAL HIGH (ref 0.0–2.0)
Bicarbonate: 10 mEq/L — ABNORMAL LOW (ref 20.0–24.0)
Bicarbonate: 13.7 mEq/L — ABNORMAL LOW (ref 20.0–24.0)
Drawn by: 29017
FIO2: 0.21 %
FIO2: 0.21 %
O2 Saturation: 97.7 %
O2 Saturation: 98.8 %
Patient temperature: 98.6
Patient temperature: 98.6
TCO2: 10.5 mmol/L (ref 0–100)
TCO2: 14.4 mmol/L (ref 0–100)
pCO2 arterial: 14.4 mmHg — CL (ref 35.0–45.0)
pCO2 arterial: 21 mmHg — ABNORMAL LOW (ref 35.0–45.0)
pH, Arterial: 7.431 (ref 7.350–7.450)
pH, Arterial: 7.459 — ABNORMAL HIGH (ref 7.350–7.450)
pO2, Arterial: 131 mmHg — ABNORMAL HIGH (ref 80.0–100.0)
pO2, Arterial: 86.7 mmHg (ref 80.0–100.0)

## 2014-02-27 LAB — HEPATITIS C ANTIBODY: HCV Ab: REACTIVE — AB

## 2014-02-27 LAB — HEMATOCRIT
HEMATOCRIT: 26.5 % — AB (ref 39.0–52.0)
HEMATOCRIT: 26.2 % — AB (ref 39.0–52.0)

## 2014-02-27 LAB — AMMONIA: Ammonia: 68 umol/L — ABNORMAL HIGH (ref 11–60)

## 2014-02-27 LAB — LACTIC ACID, PLASMA: Lactic Acid, Venous: 4.5 mmol/L — ABNORMAL HIGH (ref 0.5–2.2)

## 2014-02-27 LAB — HEPATITIS B SURFACE ANTIGEN: Hepatitis B Surface Ag: NEGATIVE

## 2014-02-27 LAB — PROTIME-INR
INR: 1.18 (ref 0.00–1.49)
Prothrombin Time: 15 s (ref 11.6–15.2)

## 2014-02-27 LAB — SALICYLATE LEVEL: Salicylate Lvl: <2 mg/dL — ABNORMAL LOW (ref 2.8–20.0)

## 2014-02-27 MED ORDER — SODIUM CHLORIDE 0.9 % IV BOLUS (SEPSIS)
750.0000 mL | Freq: Once | INTRAVENOUS | Status: AC
  Administered 2014-02-27: 750 mL via INTRAVENOUS

## 2014-02-27 MED ORDER — SODIUM BICARBONATE 8.4 % IV SOLN
INTRAVENOUS | Status: DC
  Administered 2014-02-27 – 2014-03-01 (×5): via INTRAVENOUS
  Filled 2014-02-27 (×7): qty 850

## 2014-02-27 MED ORDER — SODIUM CHLORIDE 0.9 % IV SOLN
INTRAVENOUS | Status: DC
  Administered 2014-02-27: 1.5 [IU]/h via INTRAVENOUS
  Filled 2014-02-27: qty 2.5

## 2014-02-27 MED ORDER — DEXTROSE 5 % IV SOLN: INTRAVENOUS | Status: DC

## 2014-02-27 MED ORDER — INSULIN ASPART 100 UNIT/ML ~~LOC~~ SOLN
2.0000 [IU] | SUBCUTANEOUS | Status: DC
  Administered 2014-02-28 (×2): 2 [IU] via SUBCUTANEOUS
  Administered 2014-02-28: 4 [IU] via SUBCUTANEOUS
  Administered 2014-02-28 – 2014-03-02 (×3): 2 [IU] via SUBCUTANEOUS
  Administered 2014-03-02: 4 [IU] via SUBCUTANEOUS

## 2014-02-27 NOTE — Progress Notes (Addendum)
PROGRESS NOTE  Arthur Lawrence DOB: 1951/05/15 DOA: 02/21/2014 PCP: Arthur Lawrence  Assessment/Plan: CVA- brain stem: MRI shows: Numerous punctate and patchy supratentorial infarcts bilaterally. Acute small nonhemorrhagic infarct inferior left pons. Findings raise possibility of embolic disease. Remote partially hemorrhagic left thalamic and right pontine infarct. Remote left pontine infarct. 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 reactive  Hyperglycemia- may need insulin gtt  AKI- IVF, place foley as bladder scan with > 500 cc  Ammonia- elevated, monitor  Patient to be transferred to ICU for higher level of care   Code Status: full Family Communication: wife at bedside Disposition Plan:    Consultants:  Neurology  PCCM   Procedures:      HPI/Subjective: Per nursing last PM became less responsive, opens eyes and tracts.  Still with BRB per rectum  Objective: Filed Vitals:   02/27/14 0500  BP:   Pulse:   Temp: 99.1 F (37.3 C)  Resp:     Intake/Output Summary (Last 24 hours) at 02/27/14 0814 Last data filed at 02/27/14 0500  Gross per 24 hour  Intake   2125 ml  Output      0 ml  Net   2125 ml   Filed Weights   02/27/14 0245 02/27/14 0400  Weight: 66.452 kg (146 lb 8 oz) 66 kg (145 lb 8.1 oz)    Exam:   General:  Opens eyes to voice, squeezed hand on right  Cardiovascular: tachy  Respiratory: clear, no wheezing  Abdomen: +BS, soft   Data Reviewed: Basic Metabolic Panel:  Recent Labs Lab 02/22/2014 0934 03/16/2014 0953 02/27/14 0224  NA 141 141 142  K 4.8 4.7 4.9  CL 106 109 112  CO2 19  --  13*  GLUCOSE 323* 323* 316*  BUN 31* 29* 39*  CREATININE 1.28 1.30 1.86*  CALCIUM 8.7  --  7.0*    Liver Function Tests:  Recent Labs Lab 03/04/2014 0934 02/27/14 0224  AST 59* 39*  ALT 66* 44  ALKPHOS 70 42  BILITOT 0.6 1.0  PROT 6.0 4.3*  ALBUMIN 3.0* 2.2*   No results found for this basename: LIPASE, AMYLASE,  in the last 168 hours  Recent Labs Lab 02/27/14 0224  AMMONIA 68*   CBC:  Recent Labs Lab 02/21/2014 0934 02/28/2014 0953 02/23/2014 2153 02/27/14 0224  WBC 13.2*  --   --  15.3*  NEUTROABS 10.2*  --   --   --   HGB 10.5* 10.5* 8.0* 9.3*  9.3*  HCT 30.6* 31.0* 22.5* 26.5*  25.8*  MCV 89.5  --   --  88.1  PLT 236  --   --  125*   Cardiac Enzymes: No results found for this basename: CKTOTAL, CKMB, CKMBINDEX, TROPONINI,  in the last 168 hours BNP (last 3 results) No results found for this basename: PROBNP,  in the last 8760 hours CBG:  Recent Labs Lab 03/16/2014 1648 02/28/2014 1857 02/27/2014 2013 03/08/2014 2327 02/27/14 0345  GLUCAP 229* 235* 241* 335* 290*    Recent Results (from the past 240 hour(s))  MRSA PCR SCREENING     Status: None   Collection Time    03/11/2014  3:12 PM      Result Value Ref Range Status   MRSA by PCR NEGATIVE  NEGATIVE Final  Comment:            The GeneXpert MRSA Assay (FDA     approved for NASAL specimens     only), is one component of a     comprehensive MRSA colonization     surveillance program. It is not     intended to diagnose MRSA     infection nor to guide or     monitor treatment for     MRSA infections.     Studies: Ct Angio Head W/cm &/or Wo Cm  02/27/2014   CLINICAL DATA:  Unresponsive, lethargy.  EXAM: CT ANGIOGRAPHY HEAD  TECHNIQUE: Multidetector CT imaging of the head was performed using the standard protocol during bolus administration of intravenous contrast. Multiplanar CT image reconstructions and MIPs were obtained to evaluate the vascular anatomy.  CONTRAST:  50 cc Omnipaque 300  COMPARISON:  CT angiogram of the head February 26, 2014  FINDINGS: CT head:  The ventricles and sulci are normal for  age. No intraparenchymal hemorrhage, mass effect nor midline shift. Confluent supratentorial white matter hypodensities are wit similar. Patchy pontine hypodensity. No acute large vascular territory infarcts.  No abnormal extra-axial fluid collections. Basal cisterns are patent. Moderate calcific atherosclerosis of the carotid siphons.  No skull fracture. The included ocular globes and orbital contents are non-suspicious. The mastoid aircells and included paranasal sinuses are well-aerated.  No abnormal parenchymal nor extra-axial enhancement.  CTA head:  Anterior circulation: Normal appearance of the cervical internal carotid arteries, petrous, cavernous and supra clinoid internal carotid arteries. Mild calcific atherosclerosis of the carotid siphons. Widely patent anterior communicating artery. Normal appearance of the anterior and middle cerebral arteries. Early LEFT middle cerebral artery bifurcation, minimal irregularity of the M2 segments.  Posterior circulation: Codominant vertebral arteries with normal appearance of the vertebral arteries, vertebrobasilar junction and basilar artery, as well as main branch vessels. Mild calcific atherosclerosis. Patent of the posterior cerebral arteries, minimal luminal irregularity.  No large vessel occlusion, hemodynamically significant stenosis, dissection, contrast extravasation or aneurysm within the anterior nor posterior circulation.  Review of the MIP images confirms the above findings.  IMPRESSION: CT head: Stable appearance of the head: Severe white matter changes suggest chronic small vessel ischemic disease. Stable pontine infarct.  CTA head: No acute vascular process or hemodynamically significant stenosis.  Mild irregularity of the intracranial vessels most consistent with atherosclerosis.   Electronically Signed   By: Awilda Metro   On: 02/27/2014 02:43   Ct Angio Head W/cm &/or Wo Cm  03/15/2014   CLINICAL DATA:  Acute onset of dizziness and  left-sided weakness. Confusion.  EXAM: CT ANGIOGRAPHY HEAD AND NECK  TECHNIQUE: Multidetector CT imaging of the head and neck was performed using the standard protocol during bolus administration of intravenous contrast. Multiplanar CT image reconstructions and MIPs were obtained to evaluate the vascular anatomy. Carotid stenosis measurements (when applicable) are obtained utilizing NASCET criteria, using the distal internal carotid diameter as the denominator.  CONTRAST:  80mL OMNIPAQUE IOHEXOL 350 MG/ML SOLN  COMPARISON:  CT without contrast.  FINDINGS: CTA HEAD FINDINGS  The source images demonstrate normal gray-white differentiation the cortex and basal ganglia. No acute cortical infarct is evident. Focal hypoattenuation is noted within the brainstem and brainstem infarct is not excluded. This finding is consistent across the pre and postcontrast images.  Atherosclerotic calcifications are present within the cavernous carotid arteries without significant stenoses relative to the more distal vessels. The ICA terminus is intact bilaterally. The A1 and M1 segments  are normal. No significant anterior communicating artery is evident. The the MCA bifurcations are intact. The ACA and MCA branch vessels are within normal limits.  As stated, there is focal atherosclerotic calcification involving the dural margin of the left vertebral artery without a significant stenosis. The PICA origins are visualized and normal. The basilar artery is within normal limits. Both posterior cerebral arteries originate from the basilar tip. The PCA branch vessels are unremarkable.  The dural sinuses are patent.  Review of the MIP images confirms the above findings.  CTA NECK FINDINGS  The aortic arch is incompletely evaluated. The vertebral arteries originate from the subclavian arteries. The left vertebral artery is dominant. Atherosclerotic changes are present at the origins of both vertebral arteries. A moderate to high-grade stenosis  is present on the right. No additional stenoses are evident. The vertebral arteries are codominant at the level of the dural margin. Vascular calcifications are present in the left vertebral artery at the foramen magnum.  The right common carotid artery is within normal limits. Minimal atherosclerotic calcifications are present in the proximal right ICA. There is no significant stenosis.  The left common carotid artery is within normal limits. The bifurcation demonstrates minimal atherosclerotic calcification. There is no significant stenosis. Cervical ICA is normal.  The soft tissues of the neck are unremarkable.  Review of the MIP images confirms the above findings.  IMPRESSION: 1. Age indeterminate infarcts involving the brainstem. 2. No significant supratentorial infarct. 3. 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 The vertebral arteries are codominant distally. 4. Minimal atherosclerotic changes at the carotid bifurcations bilaterally without significant stenosis. 5. Atherosclerotic changes within the cavernous carotid arteries bilaterally without significant stenosis. 6. Mild distal small vessel disease. These results were called by telephone at the time of interpretation on 03/13/2014 at 12:15 pm to Dr. Thana Farr , who verbally acknowledged these results.   Electronically Signed   By: Gennette Pac M.D.   On: 02/18/2014 12:15   Ct Head Wo Contrast  02/21/2014   CLINICAL DATA:  Code stroke.  Altered mental status.  EXAM: CT HEAD WITHOUT CONTRAST  TECHNIQUE: Contiguous axial images were obtained from the base of the skull through the vertex without intravenous contrast.  COMPARISON:  None.  FINDINGS: Extensive age advanced chronic microvascular changes throughout the deep white matter. Areas of low density noted within the pons, likely old infarcts. No acute infarction. No hemorrhage or hydrocephalus. No acute calvarial abnormality.  IMPRESSION: Age advanced chronic  microvascular changes throughout the deep white matter.  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  Critical Value/emergent results were called by telephone at the time of interpretation on 02/25/2014 at 10:00 am to Dr. Thad Ranger , who verbally acknowledged these results.   Electronically Signed   By: Charlett Nose M.D.   On: 02/23/2014 10:01   Ct Angio Neck W/cm &/or Wo/cm  03/11/2014   CLINICAL DATA:  Acute onset of dizziness and left-sided weakness. Confusion.  EXAM: CT ANGIOGRAPHY HEAD AND NECK  TECHNIQUE: Multidetector CT imaging of the head and neck was performed using the standard protocol during bolus administration of intravenous contrast. Multiplanar CT image reconstructions and MIPs were obtained to evaluate the vascular anatomy. Carotid stenosis measurements (when applicable) are obtained utilizing NASCET criteria, using the distal internal carotid diameter as the denominator.  CONTRAST:  80mL OMNIPAQUE IOHEXOL 350 MG/ML SOLN  COMPARISON:  CT without contrast.  FINDINGS: CTA HEAD FINDINGS  The source images demonstrate normal gray-white differentiation the cortex and basal  ganglia. No acute cortical infarct is evident. Focal hypoattenuation is noted within the brainstem and brainstem infarct is not excluded. This finding is consistent across the pre and postcontrast images.  Atherosclerotic calcifications are present within the cavernous carotid arteries without significant stenoses relative to the more distal vessels. The ICA terminus is intact bilaterally. The A1 and M1 segments are normal. No significant anterior communicating artery is evident. The the MCA bifurcations are intact. The ACA and MCA branch vessels are within normal limits.  As stated, there is focal atherosclerotic calcification involving the dural margin of the left vertebral artery without a significant stenosis. The PICA origins are visualized and normal. The basilar artery is within normal limits. Both posterior cerebral arteries originate  from the basilar tip. The PCA branch vessels are unremarkable.  The dural sinuses are patent.  Review of the MIP images confirms the above findings.  CTA NECK FINDINGS  The aortic arch is incompletely evaluated. The vertebral arteries originate from the subclavian arteries. The left vertebral artery is dominant. Atherosclerotic changes are present at the origins of both vertebral arteries. A moderate to high-grade stenosis is present on the right. No additional stenoses are evident. The vertebral arteries are codominant at the level of the dural margin. Vascular calcifications are present in the left vertebral artery at the foramen magnum.  The right common carotid artery is within normal limits. Minimal atherosclerotic calcifications are present in the proximal right ICA. There is no significant stenosis.  The left common carotid artery is within normal limits. The bifurcation demonstrates minimal atherosclerotic calcification. There is no significant stenosis. Cervical ICA is normal.  The soft tissues of the neck are unremarkable.  Review of the MIP images confirms the above findings.  IMPRESSION: 1. Age indeterminate infarcts involving the brainstem. 2. No significant supratentorial infarct. 3. 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 The vertebral arteries are codominant distally. 4. Minimal atherosclerotic changes at the carotid bifurcations bilaterally without significant stenosis. 5. Atherosclerotic changes within the cavernous carotid arteries bilaterally without significant stenosis. 6. Mild distal small vessel disease. These results were called by telephone at the time of interpretation on 02/23/2014 at 12:15 pm to Dr. Thana Farr , who verbally acknowledged these results.   Electronically Signed   By: Gennette Pac M.D.   On: 02/25/2014 12:15   Mr Brain Wo Contrast  02/27/2014   CLINICAL DATA:  63 year old diabetic hypertensive male with hyperlipidemia and  hepatitis-C presenting with lethargy and unresponsiveness. Initial encounter.  EXAM: MRI HEAD WITHOUT CONTRAST  MRA HEAD WITHOUT CONTRAST  TECHNIQUE: Multiplanar, multiecho pulse sequences of the brain and surrounding structures were obtained without intravenous contrast. Angiographic images of the head were obtained using MRA technique without contrast.  COMPARISON:  02/18/2014 CT and CT angiogram.  FINDINGS: MRI HEAD FINDINGS  Exam is motion degraded.  Numerous punctate and patchy supratentorial infarcts bilaterally. Acute small nonhemorrhagic infarct inferior left pons. Findings raise possibility of embolic disease.  Remote partially hemorrhagic left thalamic and right pontine infarct. Remote left pontine infarct.  Moderate small vessel disease type changes.  No intracranial mass lesion noted on this unenhanced exam.  Mild global atrophy without hydrocephalus  Mild exophthalmos.  MRA HEAD FINDINGS  Secondary to motion degradation, it is difficult to evaluate for aneurysm or adequately grade stenosis.  Flow is noted within medium and large size intracranial vessels.  Limited evaluation of branch vessels particularly right posterior inferior cerebellar artery.  IMPRESSION: MRI HEAD:  Exam is motion degraded.  Numerous punctate and patchy supratentorial infarcts bilaterally. Acute small nonhemorrhagic infarct inferior left pons. Findings raise possibility of embolic disease.  Remote partially hemorrhagic left thalamic and right pontine infarct. Remote left pontine infarct.  Moderate small vessel disease type changes.  MRA HEAD:  Secondary to motion degradation, it is difficult to evaluate for aneurysm or adequately grade stenosis.  Flow is noted within medium and large size intracranial vessels.  Limited evaluation of branch vessels particularly right posterior inferior cerebellar artery.  These results will be called to the ordering clinician or representative by the Radiologist Assistant, and communication  documented in the PACS or zVision Dashboard.   Electronically Signed   By: Bridgett LarssonSteve  Olson M.D.   On: 02/27/2014 07:55   Dg Chest Port 1 View  02/27/2014   CLINICAL DATA:  Aspiration of gastric contents.  Initial encounter.  EXAM: PORTABLE CHEST - 1 VIEW  COMPARISON:  02/01/2009  FINDINGS: Normal heart size and mediastinal contours. No acute infiltrate or edema. No effusion or pneumothorax. No acute osseous findings.  IMPRESSION: No findings to suggest aspiration or pneumonia.   Electronically Signed   By: Tiburcio PeaJonathan  Watts M.D.   On: 02/27/2014 03:02   Mr Maxine GlennMra Head/brain Wo Cm  02/27/2014   CLINICAL DATA:  63 year old diabetic hypertensive male with hyperlipidemia and hepatitis-C presenting with lethargy and unresponsiveness. Initial encounter.  EXAM: MRI HEAD WITHOUT CONTRAST  MRA HEAD WITHOUT CONTRAST  TECHNIQUE: Multiplanar, multiecho pulse sequences of the brain and surrounding structures were obtained without intravenous contrast. Angiographic images of the head were obtained using MRA technique without contrast.  COMPARISON:  2013-08-07 CT and CT angiogram.  FINDINGS: MRI HEAD FINDINGS  Exam is motion degraded.  Numerous punctate and patchy supratentorial infarcts bilaterally. Acute small nonhemorrhagic infarct inferior left pons. Findings raise possibility of embolic disease.  Remote partially hemorrhagic left thalamic and right pontine infarct. Remote left pontine infarct.  Moderate small vessel disease type changes.  No intracranial mass lesion noted on this unenhanced exam.  Mild global atrophy without hydrocephalus  Mild exophthalmos.  MRA HEAD FINDINGS  Secondary to motion degradation, it is difficult to evaluate for aneurysm or adequately grade stenosis.  Flow is noted within medium and large size intracranial vessels.  Limited evaluation of branch vessels particularly right posterior inferior cerebellar artery.  IMPRESSION: MRI HEAD:  Exam is motion degraded.  Numerous punctate and patchy  supratentorial infarcts bilaterally. Acute small nonhemorrhagic infarct inferior left pons. Findings raise possibility of embolic disease.  Remote partially hemorrhagic left thalamic and right pontine infarct. Remote left pontine infarct.  Moderate small vessel disease type changes.  MRA HEAD:  Secondary to motion degradation, it is difficult to evaluate for aneurysm or adequately grade stenosis.  Flow is noted within medium and large size intracranial vessels.  Limited evaluation of branch vessels particularly right posterior inferior cerebellar artery.  These results will be called to the ordering clinician or representative by the Radiologist Assistant, and communication documented in the PACS or zVision Dashboard.   Electronically Signed   By: Bridgett LarssonSteve  Olson M.D.   On: 02/27/2014 07:55    Scheduled Meds: .  stroke: mapping our early stages of recovery book   Does not apply Once  . sodium chloride   Intravenous STAT  . sodium chloride   Intravenous Once  . insulin aspart  0-9 Units Subcutaneous 6 times per day  . sodium chloride  3 mL Intravenous Q12H   Continuous Infusions: . sodium chloride 100 mL/hr at Jul 18, 2013 2250  Antibiotics Given (last 72 hours)   None      Active Problems:   GI bleeding   DM (diabetes mellitus)   HTN (hypertension)   Cerebral embolism with cerebral infarction    Time spent: 35 min    Urias Sheek  [facility] Pager [phone]. If 7PM-7AM, please contact night-coverage at [email or web address], password [facility] 02/27/2014, 8:14 AM  LOS: 1 day

## 2014-02-27 NOTE — Procedures (Signed)
History: 63 yo M with AMS in the setting of GI bleeds  Sedation: None  Technique: This is a 17 channel routine scalp EEG performed at the bedside with bipolar and monopolar montages arranged in accordance to the international 10/20 system of electrode placement. One channel was dedicated to EKG recording.    Background: The predominance of this EEG is performed during sleep. There is a brief period of waking with some cnotinued irregular delta activity. There is a posterior dominant rhythm of 8 Hz taht is seen during this period. He has bilaterally symmetric sleep structures at times. No epileptiform activity is seen.   Photic stimulation: Physiologic driving is not performed  EEG Abnormalities: 1) Mild generalized irregular delta activity.   Clinical Interpretation: This normal EEG is consistent with a mild generalized non-specific cerebral dysfunction(encephalopathy). There was no seizure or seizure predisposition recorded on this study.   Ritta SlotMcNeill Renita Brocks, MD [facility] [phone]  If 7pm- 7am, please page neurology on call as listed in AMION.

## 2014-02-27 NOTE — Progress Notes (Signed)
Echocardiogram 2D Echocardiogram has been performed.  Gladine Plude 02/27/2014, 12:49 PM

## 2014-02-27 NOTE — Progress Notes (Signed)
PT Cancellation Note  Patient Details Name: Westley HummerRonald B Leach MRN: [id number] DOB: 1951/04/17   Cancelled Treatment:    Reason Eval/Treat Not Completed: Patient not medically ready.  Noted events overnight.  Will hold PT eval today and f/u tomorrow if pt is medically appropriate.     Sumit Branham, Alison MurrayMegan F 02/27/2014, 7:39 AM

## 2014-02-27 NOTE — Progress Notes (Signed)
Inpatient Diabetes Program Recommendations  AACE/ADA: New Consensus Statement on Inpatient Glycemic Control (2013)  Target Ranges:  Prepandial:   less than 140 mg/dL      Peak postprandial:   less than 180 mg/dL (1-2 hours)      Critically ill patients:  140 - 180 mg/dL   Results for Arthur Lawrence, [name] (MRN [id number]) as of 02/27/2014 08:20  Ref. Range 02/15/2014 10:02 03/07/2014 16:48 03/13/2014 18:57 03/03/2014 20:13 02/23/2014 23:27 02/27/2014 03:45  Glucose-Capillary Latest Range: 70-99 mg/dL 536293 (H) 644229 (H) 034235 (H) 241 (H) 335 (H) 290 (H)   Diabetes history: DM2 Outpatient Diabetes medications: Invokana 300 mg daily, Metformin 500 mg QAM Current orders for Inpatient glycemic control: Novolog 0-9 units Q4H  Inpatient Diabetes Program Recommendations Insulin - Basal: Please consider order low dose basal insulin; recommend ordering Levemir 10 units Q24H starting now (based on 66 kg x 0.15 units). Correction (SSI): May want to consider increasing Novolog correction to moderate scale.  Thanks, Orlando PennerMarie Amaris Delafuente, RN, MSN, CCRN Diabetes Coordinator Inpatient Diabetes Program [phone] (Team Pager) [phone] ([facility] office) [phone] [facility] office)

## 2014-02-27 NOTE — Progress Notes (Signed)
Event: Notified by RN that pt very lethargic and sleepy since 1900 and now unresponsive. T-99.3, BP 100/67, P-120, R-34 w/ 02 sats of 100% on r/a. Stat ABG ordered. NP to bedside. Subjective: Pt unresponsive and unable ti provide information.  Wife, Arthur Lawrence, who is an Charity fundraiserN, reported pt was awake, conversing and sipping clear liquids upon his return from his procedure at approx 1700. He appeared to "fall asleep" at approx 1845 so no one attempted to awaken him. It was only when he began to breath loudly and fast combined w/ snoring that she and RN became concerned. When they attempted to awaken him he would not wake up.  Objective: [name] is a 63 y.o. male who was initially brought in to ED earlier today as a code stroke. Pt reported he got up to use the bathroom and noticed BRB in the toilet. When he got up from the toilet, he felt weak all over. He did not fall. Family reported that pt was leaning toward the left and speech was nonsensical. In ED, pt had several more bouts of bloody stools and a near-syncopal event when he got up to the bedside commode. CT head and CTA head and neck were w/o acute findings. Neurology was initially consulted but pt deemed not a candidate for TPA d/t resolution of sx's and current GI bleed. Admission Hb 10.5, pt rec'd 2 units PRBC's and a f/u H&H at 2153 revealed Hb 8.0. 2 additional units PRBC's ordered. Pt has had one large bloody stool just after 1900 but none since. Pt had endoscopic and sigmoidoscopic evaluations today by GI service and rec'd Versed 2 mg IV. Has rec'd no further sedating medications. At bedside pt noted w/ somewhat rapid respirations w/ loud snoring. Pt grimaces only to deep sternal rub. He does not open his eyes. PEARRL though somewhat sluggish. Diminished gag reflex. Skin w/d, color somewhat pale. BP-99/57, P-120's, R-32-34 w/ 02 sats of 98-100% on r/a. ABG on r/a reveals pH-7.45, pC02-14.4, p02-86.7, w/ bicarb of 10. Unable to get stat labs as pt still  receiving blood.  Assessment/Plan: 1. AMS (unresponsive): Unclear etiology at this point. Do not believe medication related. Possible neurological event. Discussed pt w/ Dr Hosie PoissonSumner w/ neurology service who came to see pt at bedside. He has recommended and ordered stat CTA head.  Will obtain stat CTA.  CBC, CMP, lactate and ammonia levels when able to draw blood. F/u CTA and labs. Consider CCM consult. Discussed pt w/ Dr Toniann FailKakrakandy who is in agreement w/ plan. Will continue to monitor closely in SDU.   Leanne ChangKatherine P. Fronnie Urton, NP-C [facility] Pager [phone]

## 2014-02-27 NOTE — Progress Notes (Addendum)
Notified by RN that patients mental status declined over night. Ordered stat head CT and CTA which were unremarkable. Based on lab work suspect AMS may be metabolic in nature. If no improvement will consider EEG.   Addendum 02/27/2014 5:30am: He continues to have depressed mental status. Will open eyes to noxious stimuli and minimally localize. Moans but no verbal output. Remains tachycardic with relative hypotension. Labs pertinent for ammonia of 68 and lactic acid of 4.5. Unclear etiology of symptoms. Differential would include seizure activity vs infarct, with depressed mental status would have concern for brain stem infarct. Hospitalist discussed with radiology and he will hopefully get MRI at 6am. Will aim for EEG after MRI. Page sent to EEG tech and EEG ordered stat.

## 2014-02-27 NOTE — Progress Notes (Signed)
Responded to referral from Department Director Chaplain Theda BelfastBob Hamilton to provide emotional and spiritual  support to patient and family.  Patient reported to Ed after experiencing dizziness and finding blood in stool. Per patient's wife patient has had a stroke and recent surgery. I explored with family their immediate concerns and needs. Wife indicated that for now ministry of presence, ongoing chaplain support and prayer would be sufficient. I  prayed with family at bedside and offered words of encouragement and empathetic listening. Wife ,two daughters,son and grandson at bedside. I made unit chaplain aware that family was transferred from 2 S05 to 363m01 so that support can continue. Will follow as needed.  02/27/14 0900  Clinical Encounter Type  Visited With Patient and family together;Health care provider  Visit Type Initial;Spiritual support  Referral From Chaplain  Spiritual Encounters  Spiritual Needs Prayer;Emotional  Stress Factors  Patient Stress Factors None identified  Family Stress Factors Family relationships;Health changes;Major life changes  Tamyah Cutbirth, Chaplain,[phone]

## 2014-02-27 NOTE — Progress Notes (Signed)
Reassessment: CTA head was noted w/o acute changes. Follow-up labs remarkable for ammonia level of 68, Hb 9.3, lactic acid of 4.5 and calcium of 7.0 (8.4 corrected) from 8.7. Remaining labs w/o significant changes. Salicylate and acetaminophen levels added. Discussed pt w/ Dr Vassie LollAlva w/ [facility] for further recommendations. Agrees w/ Dr Hosie PoissonSumner that there is some concern for brain stem infarct and recommended MRI/MRA brain and EEG as soon as possible. Pt now opening his eyes, will track only briefly then gaze off or fall back asleep. He still does not respond verbally but does make quiet moaning sounds. He does not follow commands. Dr Hosie PoissonSumner came to bedside again and assessed pt. Wife is understandably quite concerned but very appreciative of all that has been done.  I spoke with radiology staff who agreed to communicate w/ MRI staff upon their arrival at 0600 to try and make Mr Georgette DoverShoaf their first case this am. EEG is to follow soon after. Pt's tachycardia has improved but persist, BP improved and pt remains afebrile. Respirations now back to baseline at 22-24 w/ 02 sats of 100% on r/a. No airway compromise at this time. Will continue to monitor closely on SDU.   Leanne ChangKatherine P. Khylon Davies, NP-C [facility] Pager [phone]

## 2014-02-27 NOTE — Progress Notes (Signed)
STAT EEG completed; results pending. 

## 2014-02-27 NOTE — Progress Notes (Signed)
With pt going to for stat MRI. EEG called and will plans to be at bedside at 0730

## 2014-02-27 NOTE — Progress Notes (Signed)
Events from yesterday following his endoscopy and colonoscopy noted.   Specifically, he had continued passage of blood without severe hypotension (systolic blood pressures briefly in the upper 80s, but generally over 100) and without profound anemia (minimal hemoglobin recorded was 8.0).   It appears that he has received a total of 4 units of packed cells since admission. His hemoglobin this morning is 9.3. Current vitals show tachycardia with mildly elevated blood pressure.  During the evening, he developed a decrease in mental status with MRI scanning today suggesting possible strokes involving the brainstem.  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 (Hepatitis B surface antigen negative).  From conversation with the nurse, it does not sound as though he has had any passage of blood per rectum today.   Neurologically, the patient remains significantly somnolent or obtunded, but does arouse to tactile stimulation. He is essentially nonverbal.  I will continue to follow this patient with you for the time being. In the event of a destabilizing, significant rebleeding episode, would consider moving him straight to vascular radiology since the presumed site of his diverticular bleeding has been marked with a clip.  Florencia Reasonsobert V. Rashied Corallo, M.D. [phone]

## 2014-02-27 NOTE — Progress Notes (Signed)
Paged [facility] Matthias [facility]ccini, [facility], regarding patient's [facility].  Dr said it was appropriate, once able, to resume clear liquid diet.  Do not advance until further GI assessment. RN will do bedside swallow, per Stroke protocol, to determine if able. Arthur Lawrence C

## 2014-02-27 NOTE — Consult Note (Signed)
PULMONARY / CRITICAL CARE MEDICINE   Name: Arthur Lawrence MRN: [id number] DOB: 12/12/1950    ADMISSION DATE:  03/06/2014 CONSULTATION DATE:  02/27/14  REFERRING MD :  Dr. Benjamine Mola   CHIEF COMPLAINT:  AMS, Vomiting   INITIAL PRESENTATION: 63 y/o AAM with PMH of HTN & DM who presented as a code stroke on 10/12 and had BRBPR.  CT head negative.  Pt underwent EGD & Colonoscopy on the evening of 10/12.  Post procedure, the patient later developed vomiting and decreased mental status.  10/13 MRI completed with concerns for watershed stroke.    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 10/13  MRI / MRA Head >> numerous punctate & patchy supratentorial infarcts bilaterally, small infarct inferior L pons 10/13  EEG >> normal EEG is consistent with a mild generalized non-specific cerebral dysfunction (encephalopathy). There was no seizure recorded on this study   SIGNIFICANT EVENTS: 10/12  Admit with near syncope, BRBPR, weakness on L.  Neg CT Head.  EGD & Colonoscopy performed.     HISTORY OF PRESENT ILLNESS:  63 y/o AAM, never smoker,  with PMH of HTN, Chronic Active Hepatitis C & DM who presented to [facility] ER on 02/22/2014 via EMS as a code stroke.  He spoke to Arthur Lawrence the am of admission around 0530 and was  normal.  He daughter thought she heard him fall around 0730 but he called out and said he dropped something.  Around 0845 he was noted by Arthur daughter to have slurred speech, not making sense and an "off" gait with weakness on the left.  He also had several bloody stools the am of admission.  He was evaluated as a code stroke in the ER and the CT of the head showed no acute changes but advanced small vessel disease.  BP was soft in the ER with systolic range of 70-90.  He had apparent resolution of symptoms and given GI bleeding, he was considered to not be a tPA candidate.  In the ER, he later had a near syncopal episode while trying to stand to use the urinal.  The patient was admitted per [facility], given 2 units of PRBC's and made NPO for GI evaluation.  He further underwent EGD & Colonoscopy per Dr. Matthias Hughs.  EGD was negative.  Colonoscopy was suspicious for diverticular hemorrhage originating from the region of the hepatic flexure.  The patient had several episodes of vomiting since admission.  Around 0100 the patient was more lethargic and Neurology was called by [facility] NP.  Repeat CTA of the head was neg for hemorrhage, severe white matter changes suggestive of chronic small vessel ischemic disease, stable pontine infarct and no acute vascular process.  He was further evaluated with an MRI the am of 10/13 which reflected numerous punctate & patchy supratentorial infarcts bilaterally, small infarct inferior L pons.  PCCM was consulted for evaluation am of 10/13.  Transferred to ICU.   PAST MEDICAL HISTORY :   has a past medical history of Hypertension; Allergic rhinitis; Hyperlipidemia due to type 1 diabetes mellitus; Type II diabetes mellitus; History of blood transfusion (1976; 02/16/2014); Hepatitis C; and Acute lower GI bleeding (03/13/2014).  has past surgical history that includes Laparoscopic cholecystectomy (2013); Esophagogastroduodenoscopy (N/A, 02/15/2014); and Colonoscopy (N/A, 03/17/2014).  HOME  MEDICATIONS  Prior to Admission medications   Medication Sig Start Date End Date Taking? Authorizing Provider  aspirin EC 81 MG tablet Take 81 mg by mouth daily.   Yes Historical Provider, MD  Canagliflozin (INVOKANA) 300 MG TABS Take 300 mg by mouth daily.   Yes Historical Provider, MD  lisinopril (PRINIVIL,ZESTRIL) 40 MG tablet Take 40 mg by mouth daily.   Yes Historical Provider, MD  metFORMIN (GLUCOPHAGE-XR) 500 MG 24 hr tablet Take 500 mg by mouth daily with breakfast.   Yes Historical Provider, MD  Omega-3 Fatty Acids (FISH OIL PO) Take 1 tablet by mouth daily.   Yes Historical Provider, MD  OVER THE COUNTER MEDICATION Take 1 tablet by mouth daily. Cut   Yes Historical Provider, MD   No Known Allergies  FAMILY HISTORY:  has no family status information on file.   SOCIAL HISTORY:  reports that he has quit smoking. Arthur smoking use included Cigarettes. He smoked 0.00 packs per day. He has never used smokeless tobacco. He reports that he does not drink alcohol or use illicit drugs.  REVIEW OF SYSTEMS:  Unable to complete as patient is altered.  See HPI.    SUBJECTIVE:   VITAL SIGNS: Temp:  [97.8 F (36.6 C)-99.5 F (37.5 C)] 99.3 F (37.4 C) (10/13 0824) Pulse Rate:  [98-132] 124 (10/13 0824) Resp:  [12-37] 22 (10/13 0824) BP: (76-159)/(42-95) 159/92 mmHg (10/13 0824) SpO2:  [98 %-100 %] 100 % (10/13 0824) Weight:  [145 lb 8.1 oz (66 kg)-146 lb 8 oz (66.452 kg)] 145 lb 8.1 oz (66 kg) (10/13 0400)  HEMODYNAMICS:    VENTILATOR SETTINGS:    INTAKE / OUTPUT:  Intake/Output Summary (Last 24 hours) at 02/27/14 0839 Last data filed at 02/27/14 0825  Gross per 24 hour  Intake   2125 ml  Output    600 ml  Net   1525 ml    PHYSICAL EXAMINATION: General:  wdwn adult male in NAD  Neuro:  Lethargic, opens eyes to physical stimulation, able to squeeze on RUE to command (4/5), withdraws to pain RLE, minimal movement on L with pain HEENT:  Pink/moist, good dentition, no jvd   Cardiovascular:  s1s2 rrr, no m/r/g Lungs:  resp's even/non-labored, lungs bilaterally clear  Abdomen:  Round/soft, bsx4 active  Musculoskeletal:  No acute deformities  Skin:  Warmd/dry, no edema   LABS:  CBC  Recent Labs Lab 02/27/2014 0934 02/27/2014 0953 02/22/2014 2153 02/27/14 0224  WBC 13.2*  --   --  15.3*  HGB 10.5* 10.5* 8.0* 9.3*  9.3*  HCT 30.6* 31.0* 22.5* 26.5*  25.8*  PLT 236  --   --  125*   Coag's  Recent Labs Lab 02/28/2014 0934  APTT 25  INR 1.15   BMET  Recent Labs Lab 02/25/2014 0934 03/13/2014 0953 02/27/14 0224  NA 141 141 142  K 4.8 4.7 4.9  CL 106 109 112  CO2 19  --  13*  BUN 31* 29* 39*  CREATININE 1.28 1.30 1.86*  GLUCOSE 323* 323* 316*   Electrolytes  Recent Labs Lab 03-21-14 0934 02/27/14 0224  CALCIUM 8.7 7.0*   Sepsis Markers  Recent Labs Lab 02/27/14 0224  LATICACIDVEN 4.5*   ABG  Recent Labs Lab 21-Mar-2014 2356 02/27/14 0423  PHART 7.459* 7.431  PCO2ART 14.4* 21.0*  PO2ART 86.7 131.0*   Liver Enzymes  Recent Labs Lab 2014-03-21 0934 02/27/14 0224  AST 59* 39*  ALT 66* 44  ALKPHOS 70 42  BILITOT 0.6 1.0  ALBUMIN 3.0* 2.2*   Cardiac Enzymes No results found for this basename: TROPONINI, PROBNP,  in the last 168 hours Glucose  Recent Labs Lab 2014-03-21 1002 03-21-2014 1648 March 21, 2014 1857 March 21, 2014 2013 March 21, 2014 2327 02/27/14 0345  GLUCAP 293* 229* 235* 241* 335* 290*    Imaging Ct Angio Head W/cm &/or Wo Cm  March 21, 2014   CLINICAL DATA:  Acute onset of dizziness and left-sided weakness. Confusion.  EXAM: CT ANGIOGRAPHY HEAD AND NECK  TECHNIQUE: Multidetector CT imaging of the head and neck was performed using the standard protocol during bolus administration of intravenous contrast. Multiplanar CT image reconstructions and MIPs were obtained to evaluate the vascular anatomy. Carotid stenosis measurements (when applicable) are obtained utilizing NASCET criteria, using the distal internal carotid diameter as  the denominator.  CONTRAST:  80mL OMNIPAQUE IOHEXOL 350 MG/ML SOLN  COMPARISON:  CT without contrast.  FINDINGS: CTA HEAD FINDINGS  The source images demonstrate normal gray-white differentiation the cortex and basal ganglia. No acute cortical infarct is evident. Focal hypoattenuation is noted within the brainstem and brainstem infarct is not excluded. This finding is consistent across the pre and postcontrast images.  Atherosclerotic calcifications are present within the cavernous carotid arteries without significant stenoses relative to the more distal vessels. The ICA terminus is intact bilaterally. The A1 and M1 segments are normal. No significant anterior communicating artery is evident. The the MCA bifurcations are intact. The ACA and MCA branch vessels are within normal limits.  As stated, there is focal atherosclerotic calcification involving the dural margin of the left vertebral artery without a significant stenosis. The PICA origins are visualized and normal. The basilar artery is within normal limits. Both posterior cerebral arteries originate from the basilar tip. The PCA branch vessels are unremarkable.  The dural sinuses are patent.  Review of the MIP images confirms the above findings.  CTA NECK FINDINGS  The aortic arch is incompletely evaluated. The vertebral arteries originate from the subclavian arteries. The left vertebral artery is dominant. Atherosclerotic changes are present at the origins of both vertebral arteries. A moderate to high-grade stenosis is present on the right. No additional stenoses are evident. The vertebral arteries are codominant at the level of the dural margin. Vascular calcifications are present in the left vertebral artery at the foramen magnum.  The right common carotid artery is within normal limits. Minimal atherosclerotic calcifications are present in the proximal right ICA. There is no significant stenosis.  The left common carotid artery is within normal limits.  The bifurcation demonstrates minimal atherosclerotic calcification. There is no significant stenosis. Cervical ICA is normal.  The soft tissues of the neck are unremarkable.  Review of the MIP images confirms the above findings.  IMPRESSION: 1. Age indeterminate infarcts involving the brainstem. 2. No significant supratentorial infarct. 3. 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 The vertebral arteries are codominant distally. 4. Minimal atherosclerotic changes at the carotid bifurcations bilaterally without significant stenosis. 5. Atherosclerotic changes within the cavernous carotid arteries bilaterally without significant stenosis. 6. Mild distal small vessel disease.  These results were called by telephone at the time of interpretation on Mar 02, 2014 at 12:15 pm to Dr. Thana Farr , who verbally acknowledged these results.   Electronically Signed   By: Gennette Pac M.D.   On: Mar 02, 2014 12:15   Ct Head Wo Contrast  03/02/14   CLINICAL DATA:  Code stroke.  Altered mental status.  EXAM: CT HEAD WITHOUT CONTRAST  TECHNIQUE: Contiguous axial images were obtained from the base of the skull through the vertex without intravenous contrast.  COMPARISON:  None.  FINDINGS: Extensive age advanced chronic microvascular changes throughout the deep white matter. Areas of low density noted within the pons, likely old infarcts. No acute infarction. No hemorrhage or hydrocephalus. No acute calvarial abnormality.  IMPRESSION: Age advanced chronic microvascular changes throughout the deep white matter.  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  Critical Value/emergent results were called by telephone at the time of interpretation on 2014/03/02 at 10:00 am to Dr. Thad Ranger , who verbally acknowledged these results.   Electronically Signed   By: Charlett Nose M.D.   On: 03/02/14 10:01   Ct Angio Neck W/cm &/or Wo/cm  2014-03-02   CLINICAL DATA:  Acute onset of dizziness and left-sided weakness.  Confusion.  EXAM: CT ANGIOGRAPHY HEAD AND NECK  TECHNIQUE: Multidetector CT imaging of the head and neck was performed using the standard protocol during bolus administration of intravenous contrast. Multiplanar CT image reconstructions and MIPs were obtained to evaluate the vascular anatomy. Carotid stenosis measurements (when applicable) are obtained utilizing NASCET criteria, using the distal internal carotid diameter as the denominator.  CONTRAST:  80mL OMNIPAQUE IOHEXOL 350 MG/ML SOLN  COMPARISON:  CT without contrast.  FINDINGS: CTA HEAD FINDINGS  The source images demonstrate normal gray-white differentiation the cortex and basal ganglia. No acute cortical infarct is evident. Focal hypoattenuation is noted within the brainstem and brainstem infarct is not excluded. This finding is consistent across the pre and postcontrast images.  Atherosclerotic calcifications are present within the cavernous carotid arteries without significant stenoses relative to the more distal vessels. The ICA terminus is intact bilaterally. The A1 and M1 segments are normal. No significant anterior communicating artery is evident. The the MCA bifurcations are intact. The ACA and MCA branch vessels are within normal limits.  As stated, there is focal atherosclerotic calcification involving the dural margin of the left vertebral artery without a significant stenosis. The PICA origins are visualized and normal. The basilar artery is within normal limits. Both posterior cerebral arteries originate from the basilar tip. The PCA branch vessels are unremarkable.  The dural sinuses are patent.  Review of the MIP images confirms the above findings.  CTA NECK FINDINGS  The aortic arch is incompletely evaluated. The vertebral arteries originate from the subclavian arteries. The left vertebral artery is dominant. Atherosclerotic changes are present at the origins of both vertebral arteries. A moderate to high-grade stenosis is present on the  right. No additional stenoses are evident. The vertebral arteries are codominant at the level of the dural margin. Vascular calcifications are present in the left vertebral artery at the foramen magnum.  The right common carotid artery is within normal limits. Minimal atherosclerotic calcifications are present in the proximal right ICA. There is no significant stenosis.  The left common carotid artery is within normal limits. The bifurcation demonstrates minimal atherosclerotic calcification. There is no significant stenosis. Cervical ICA is normal.  The soft tissues of the neck are unremarkable.  Review of the MIP images confirms the above findings.  IMPRESSION: 1. Age indeterminate infarcts involving the brainstem. 2. No significant supratentorial infarct. 3. 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 The vertebral arteries are codominant distally. 4. Minimal atherosclerotic changes at the carotid bifurcations bilaterally without significant stenosis. 5. Atherosclerotic changes within the cavernous carotid arteries bilaterally without significant stenosis. 6. Mild distal small vessel disease. These results were called by telephone at the time of interpretation on 03/06/2014 at 12:15 pm to Dr. Thana FarrLESLIE REYNOLDS , who verbally acknowledged these results.   Electronically Signed   By: Gennette Pachris  Mattern M.D.   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 Acute Encephalopathy - in setting of CVA P:   Neurology following Tx to ICU for close observation Neuro checks Further imaging per Neurology    PULMONARY OETT  A: At Risk Respiratory Failure, Aspiration - in the setting of CVA P:   Raise HOB Aspiration precautions O2 to support sats > 92% Trend CXR   CARDIOVASCULAR CVL A:  Hypertension  Tachycardia - in the setting of volume loss  P:  Assess ECHO Tele monitoring  RENAL A:   Acute Kidney  Injury Elevated Lactate  Hyperkalemia  Compensated Metabolic Acidosis - likely non-gap component with GI loss P:   Trend BMP  Ensure adequate perfusion, control BP Strict I/O's, foley catheter Sodium bicarbonate gtt   GASTROINTESTINAL A:   Acute Diverticular Bleed Nausea / Vomiting  Hepatitis C P:   NPO PPI x 1 80 mg on 10/12 GI Following, appreciate input  HEMATOLOGIC A:   Anemia  Thrombocytopenia  P:  Trend CBC Tx for Hgb <7%, active bleeding or MI < 8%  INFECTIOUS A:   Leukocytosis  At Risk for Aspiration  P:   Monitor fever curve / leukocytosis  ENDOCRINE A:   Hyperglycemia   P:   SSI    Family updated:   Interdisciplinary Family Meeting v Palliative Care Meeting:     Canary BrimBrandi Ollis, NP-C [facility] Pgr: [phone] or [phone]   I have personally obtained a history, examined the patient, evaluated laboratory and imaging results, formulated the assessment and plan and placed orders.  CRITICAL CARE: The patient is critically ill with multiple organ systems failure and requires high complexity decision making for assessment and support, frequent evaluation and titration of therapies, application of advanced monitoring technologies and extensive interpretation of multiple databases. Critical Care Time devoted to patient care services described in this note is  minutes.   PCCM ATTENDING: I have interviewed and examined the patient and reviewed the database. I have formulated the assessment and plan as reflected in the note above with amendments made by me.   Moved to ICU for closer monitoring of neuro and airway statuses. Presently somnolent but arousable and following commands without obvious focal deficits. Stroke team feels this is likely embolic in nature rather than watershed infarcts. PCCM will be primary service while in ICU  Billy Fischeravid Durwood Dittus, MD;  PCCM service; Mobile [phone]    02/27/2014, 8:39 AM

## 2014-02-27 NOTE — Progress Notes (Signed)
SLP Cancellation Note  Patient Details Name: Westley HummerRonald B Manzano MRN: [id number] DOB: [birth year]   Cancelled treatment:        Decline in status; arousable to painful stimuli only. Order for speech-language-cognitive assessment.  On hold for now.  Will follow status.   Royce MacadamiaLitaker, Novak Stgermaine Willis 02/27/2014, 8:09 AM  Breck CoonsLisa Willis Lonell FaceLitaker M.Ed [employer]SL[employer] Pager [phone]

## 2014-02-27 NOTE — Progress Notes (Signed)
Paged Dr Hosie PoissonSumner for neuro consult regarding change in pts mental status. Stat CT scan ordered for pt.

## 2014-02-27 NOTE — Progress Notes (Signed)
Pt back from CT. 1st unit of PRBC complete. On the way back from CT pt threw up moderate amount of bright green emesis. Pt still unresponsive. Medium dark red (bloody) BM noted.  Galen Malkowski M. Derrell LollingIngram, RN, BSN 02/27/2014 2:23 AM

## 2014-02-27 NOTE — Progress Notes (Signed)
Pt has orders to transfer to ICU. Called to give report to ICU. The will call back to get report. Pt is going to [id number] ICU.

## 2014-02-27 NOTE — Progress Notes (Addendum)
STROKE TEAM PROGRESS NOTE   HISTORY Arthur Lawrence is an 63 y.o. male who reports that he awakened early this morning and went to the bathroom. He noted some blood in his stools but was able to go back to sleep. When he awakened again about 30 minutes later he had more blood in hid stools but noted on getting up that he was dizzy and had some left sided weakness. He had two more bouts of bloody stools. Family also noted some confusion and EMS was called. Code stroke was called in the ED.  Date last known well: Date: 03/09/2014  Time last known well: Time: 05:30  tPA Given: No: Active GI bleed, resolution of symptoms  SUBJECTIVE (INTERVAL HISTORY) His wife and sister are at the bedside.  Overall he feels his condition is controlled. Wife stated that on Sunday pm and evening he had two episodes of bloody stool, not sure the volume, and he was found to have brief slurry speech and resolved. When EMS came, he was found to have left leg and arm weakness, leg>arm, but no facial droop. BP was at 87/57. In ER, weakness improved and BP up to 110/70. Neurology called and no tPA given due to GI bleeding and improving symptoms. He underwent upper GI and lower GI testing yesterday and received 4 PRBC and Hb from 8 up to 10. However, last evening, he became drowsy sleepy and overnight, not able to arouse well, triggered MRI brain which showed potine acute stroke with embolic shower to anterior circulation. Also, multiple old pointine hemorrhage and infarct noted. He was transferred to NICU for further care.  OBJECTIVE Temp:  [97.8 F (36.6 C)-99.5 F (37.5 C)] 99 F (37.2 C) (10/13 1600) Pulse Rate:  [95-132] 106 (10/13 1500) Cardiac Rhythm:  [-] Sinus tachycardia (10/13 0930) Resp:  [12-30] 13 (10/13 1500) BP: (91-192)/(53-115) 109/63 mmHg (10/13 1500) SpO2:  [97 %-100 %] 100 % (10/13 1500) Weight:  [145 lb 8.1 oz (66 kg)-151 lb 7.3 oz (68.7 kg)] 151 lb 7.3 oz (68.7 kg) (10/13 0900)   Recent Labs Lab  02/27/14 0814 02/27/14 1155 02/27/14 1251 02/27/14 1355 02/27/14 1500  GLUCAP 295* 207* 198* 159* 148*    Recent Labs Lab 02/16/2014 0934 03/13/2014 0953 02/27/14 0224  NA 141 141 142  K 4.8 4.7 4.9  CL 106 109 112  CO2 19  --  13*  GLUCOSE 323* 323* 316*  BUN 31* 29* 39*  CREATININE 1.28 1.30 1.86*  CALCIUM 8.7  --  7.0*    Recent Labs Lab 02/23/2014 0934 02/27/14 0224  AST 59* 39*  ALT 66* 44  ALKPHOS 70 42  BILITOT 0.6 1.0  PROT 6.0 4.3*  ALBUMIN 3.0* 2.2*    Recent Labs Lab 02/20/2014 0934 03/13/2014 0953 03/17/2014 2153 02/27/14 0224 02/27/14 1007  WBC 13.2*  --   --  15.3* 18.1*  NEUTROABS 10.2*  --   --   --   --   HGB 10.5* 10.5* 8.0* 9.3*  9.3* 10.1*  HCT 30.6* 31.0* 22.5* 26.5*  25.8* 28.0*  MCV 89.5  --   --  88.1 85.1  PLT 236  --   --  125* 99*   No results found for this basename: CKTOTAL, CKMB, CKMBINDEX, TROPONINI,  in the last 168 hours  Recent Labs  02/17/2014 0934 02/27/14 1007  LABPROT 14.7 15.0  INR 1.15 1.18    Recent Labs  02/24/2014 1323  COLORURINE YELLOW  LABSPEC >1.046*  PHURINE 5.5  GLUCOSEU >1000*  HGBUR NEGATIVE  BILIRUBINUR NEGATIVE  KETONESUR 15*  PROTEINUR NEGATIVE  UROBILINOGEN 0.2  NITRITE NEGATIVE  LEUKOCYTESUR NEGATIVE    No results found for this basename: chol, trig, hdl, cholhdl, vldl, ldlcalc   No results found for this basename: HGBA1C      Component Value Date/Time   LABOPIA NONE DETECTED 03/03/2014 1323   COCAINSCRNUR NONE DETECTED 02/17/2014 1323   LABBENZ NONE DETECTED 02/17/2014 1323   AMPHETMU NONE DETECTED 02/22/2014 1323   THCU NONE DETECTED 03/15/2014 1323   LABBARB NONE DETECTED 03/09/2014 1323     Recent Labs Lab 02/16/2014 0934  ETH <11    Ct Angio Head W/cm &/or Wo Cm 02/27/2014   IMPRESSION: CT head: Stable appearance of the head: Severe white matter changes suggest chronic small vessel ischemic disease. Stable pontine infarct.  CTA head: No acute vascular process or hemodynamically  significant stenosis.  Mild irregularity of the intracranial vessels most consistent with atherosclerosis.     Ct Angio Head and neck W/cm &/or Wo Cm 02/28/2014   IMPRESSION: 1. Age indeterminate infarcts involving the brainstem. 2. No significant supratentorial infarct. 3. 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 The vertebral arteries are codominant distally. 4. Minimal atherosclerotic changes at the carotid bifurcations bilaterally without significant stenosis. 5. Atherosclerotic changes within the cavernous carotid arteries bilaterally without significant stenosis. 6. Mild distal small vessel disease.    Ct Head Wo Contrast 03/03/2014   IMPRESSION: Age advanced chronic microvascular changes throughout the deep white matter.  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     Mri and Mra Brain Wo Contrast 02/27/2014   IMPRESSION: MRI HEAD:  Exam is motion degraded.  Numerous punctate and patchy supratentorial infarcts bilaterally. Acute small nonhemorrhagic infarct inferior left pons. Findings raise possibility of embolic disease.  Remote partially hemorrhagic left thalamic and right pontine infarct. Remote left pontine infarct.  Moderate small vessel disease type changes.  MRA HEAD:  Secondary to motion degradation, it is difficult to evaluate for aneurysm or adequately grade stenosis.  Flow is noted within medium and large size intracranial vessels.  Limited evaluation of branch vessels particularly right posterior inferior cerebellar artery.    Dg Chest Port 1 View 02/27/2014  IMPRESSION: No findings to suggest aspiration or pneumonia.   E  2D echo - - Normal LV function; grade 1 diastolic dysfunction; trace TR. EF 60-65%  EEG - EEG Abnormalities: 1) Mild generalized irregular delta activity.  Clinical Interpretation: This normal EEG is consistent with a  mild generalized non-specific cerebral  dysfunction(encephalopathy). There was no seizure or seizure  predisposition  recorded on this study.   PHYSICAL EXAM  Temp:  [97.8 F (36.6 C)-99.5 F (37.5 C)] 99 F (37.2 C) (10/13 1600) Pulse Rate:  [95-132] 106 (10/13 1500) Resp:  [12-30] 13 (10/13 1500) BP: (91-192)/(53-115) 109/63 mmHg (10/13 1500) SpO2:  [97 %-100 %] 100 % (10/13 1500) Weight:  [145 lb 8.1 oz (66 kg)-151 lb 7.3 oz (68.7 kg)] 151 lb 7.3 oz (68.7 kg) (10/13 0900)  General - Well nourished, well developed, mildly drowsy.  Ophthalmologic - not cooperative on exam.  Cardiovascular - Regular rhythm with no murmur, but tachycardic.  Mental Status -  Mildly drowsy but easily arousable, know in hospital but do not know the name of hospital, not orientated to time or president, but orientated to self and wife.  Paucity of language, and significant psychomotor slowing. Able to follow simple commands, can name objects 2/3 but not able to  name parts of objects. Repetition preserved.   Cranial Nerves II - XII - II - blink bilaterally to visual threat. III, IV, VI - Extraocular movements intact. V - Facial sensation intact bilaterally. VII - right nasolabial fold flattening. VIII - Hearing & vestibular intact bilaterally. X - Palate elevates symmetrically. XI - Chin turning & shoulder shrug intact bilaterally. XII - Tongue protrusion intact.  Motor Strength - The patient's strength was 4/5 BUEs and pronator drift was absent. spontaneously moving BLEs but LLE increased muscle tone with gegenhalten type paratonia. Bulk was normal and fasciculations were absent.    Reflexes - The patient's reflexes were 1+ in all extremities and he had positive right babinski but mute left babinski.  Sensory - Light touch, temperature/pinprick were assessed and were symmetrical.    Coordination - The patient had normal movements in the hands with no ataxia or dysmetria.  Tremor was absent.  Gait and Station - not tested  ASSESSMENT/PLAN Arthur Lawrence is a 63 y.o. male with history of HTN, DM presenting  with lower GI bleeding. His last colonoscopy was 2004 when ployp removed and biopsy showed benign. He developed slurry speech and left hemiparesis but improved in ER. He did not receive IV t-PA due to GI bleeding and improving symptoms. 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 Less likely due to hypotension as the stroke distribution is not at watershed pattern.  Will consider TEE, venous doppler to rule out DVT, loop recorder and pan CT as well as hypercoagulable work up. Pt cre high so will hold off pan CT now. TEE consult tomorrow.    MRI showed small vessel events including acute and chronic pontine hemorrhage and infarct as well as cardioembolic pattern with anterior circulation embolic shower  CTA head and neck - no significant large vessel stenosis  2D Echo  unremarkable  LDL pending  HgbA1c pending  SCDs for VTE prophylaxis  NPO vs. clear liquids depending on swallow screening  Resultant psychomotor slowing and paucity of speech  aspirin 81 mg orally every day prior to admission, now on no antithrombotics  Risk factor education  Ongoing aggressive risk factor management  Therapy recommendations:  pending  Disposition:  pending  Hypertension  Home meds: lisinopril  Stable at 130-150 now  Diabetes  HgbA1c pending goal < 7.0  Uncontrolled according to the glucose monitoring while in house  On insulin drip  Educated patient about lifestyle changes for diabetes treatment  LGIB - GI on board - currently bleeding stopped - s/p 4 x PRBC transfusion - H&H stable - concerning for malignancy. Will check  CEA  Other Stroke Risk Factors Advanced age Former cigarette smoker, quit 40 years   Family hx stroke (mom)  Other Active Problems  High Cre - likely contrast related   Hospital day # 1  This patient is critically ill due to GIB requiring transfusion, AMS, brainstem stroke and embolic stroke and at significant risk of neurological worsening, death form worsening brainstem stroke, respiratory failure, anemia due to acute blood loss, worsening embolic stroke. This patient's care requires constant monitoring of vital signs, hemodynamics, respiratory and cardiac monitoring, review of multiple databases, neurological assessment, discussion  with family, other specialists and medical decision making of high complexity. I spent 40 minutes of neurocritical care time in the care of this patient.   Marvel Plan, MD PhD Stroke Neurology 02/27/2014 5:38 PM    To contact Stroke Continuity provider, please refer to [email or web address]. After hours, contact General Neurology

## 2014-02-28 ENCOUNTER — Encounter (HOSPITAL_COMMUNITY): Payer: Self-pay | Admitting: Radiology

## 2014-02-28 ENCOUNTER — Inpatient Hospital Stay (HOSPITAL_COMMUNITY): Payer: BC Managed Care – PPO

## 2014-02-28 DIAGNOSIS — R5383 Other fatigue: Secondary | ICD-10-CM

## 2014-02-28 DIAGNOSIS — I639 Cerebral infarction, unspecified: Secondary | ICD-10-CM

## 2014-02-28 DIAGNOSIS — R40244 Other coma, without documented Glasgow coma scale score, or with partial score reported: Secondary | ICD-10-CM

## 2014-02-28 DIAGNOSIS — T17910D Gastric contents in respiratory tract, part unspecified causing asphyxiation, subsequent encounter: Secondary | ICD-10-CM

## 2014-02-28 LAB — CEA: CEA: 1.2 ng/mL (ref 0.0–5.0)

## 2014-02-28 LAB — BASIC METABOLIC PANEL
Anion gap: 11 (ref 5–15)
BUN: 35 mg/dL — ABNORMAL HIGH (ref 6–23)
CALCIUM: 7.3 mg/dL — AB (ref 8.4–10.5)
CO2: 23 meq/L (ref 19–32)
CREATININE: 1.25 mg/dL (ref 0.50–1.35)
Chloride: 117 mEq/L — ABNORMAL HIGH (ref 96–112)
GFR calc non Af Amer: 60 mL/min — ABNORMAL LOW (ref >90)
GFR, EST AFRICAN AMERICAN: 69 mL/min — AB (ref >90)
Glucose, Bld: 103 mg/dL — ABNORMAL HIGH (ref 70–99)
Potassium: 3.5 mEq/L — ABNORMAL LOW (ref 3.7–5.3)
Sodium: 151 mEq/L — ABNORMAL HIGH (ref 137–147)

## 2014-02-28 LAB — T4, FREE: Free T4: 0.83 ng/dL (ref 0.80–1.80)

## 2014-02-28 LAB — TYPE AND SCREEN
ABO/RH(D): AB POS
Antibody Screen: NEGATIVE
UNIT DIVISION: 0
UNIT DIVISION: 0
UNIT DIVISION: 0
Unit division: 0

## 2014-02-28 LAB — CBC
HEMATOCRIT: 23.7 % — AB (ref 39.0–52.0)
HEMOGLOBIN: 8.3 g/dL — AB (ref 13.0–17.0)
MCH: 30 pg (ref 26.0–34.0)
MCHC: 35 g/dL (ref 30.0–36.0)
MCV: 85.6 fL (ref 78.0–100.0)
Platelets: 99 10*3/uL — ABNORMAL LOW (ref 150–400)
RBC: 2.77 MIL/uL — ABNORMAL LOW (ref 4.22–5.81)
RDW: 14 % (ref 11.5–15.5)
WBC: 17 10*3/uL — ABNORMAL HIGH (ref 4.0–10.5)

## 2014-02-28 LAB — GLUCOSE, CAPILLARY
GLUCOSE-CAPILLARY: 128 mg/dL — AB (ref 70–99)
GLUCOSE-CAPILLARY: 184 mg/dL — AB (ref 70–99)
GLUCOSE-CAPILLARY: 132 mg/dL — AB (ref 70–99)
Glucose-Capillary: 95 mg/dL (ref 70–99)
Glucose-Capillary: 112 mg/dL — ABNORMAL HIGH (ref 70–99)

## 2014-02-28 LAB — LIPID PANEL
Cholesterol: 80 mg/dL (ref 0–200)
HDL: 22 mg/dL — ABNORMAL LOW (ref >39)
LDL Cholesterol: 22 mg/dL (ref 0–99)
Total CHOL/HDL Ratio: 3.6 RATIO
Triglycerides: 182 mg/dL — ABNORMAL HIGH (ref <150)
VLDL: 36 mg/dL (ref 0–40)

## 2014-02-28 LAB — HEMOGLOBIN A1C
Hgb A1c MFr Bld: 5.7 % — ABNORMAL HIGH (ref <5.7)
Mean Plasma Glucose: 117 mg/dL — ABNORMAL HIGH (ref <117)

## 2014-02-28 LAB — HEMATOCRIT
HCT: 22.8 % — ABNORMAL LOW (ref 39.0–52.0)
HEMATOCRIT: 25.1 % — AB (ref 39.0–52.0)

## 2014-02-28 LAB — HEMOGLOBIN
HEMOGLOBIN: 8.8 g/dL — AB (ref 13.0–17.0)
Hemoglobin: 8 g/dL — ABNORMAL LOW (ref 13.0–17.0)

## 2014-02-28 LAB — TSH: TSH: 0.704 u[IU]/mL (ref 0.350–4.500)

## 2014-02-28 MED ORDER — POTASSIUM CHLORIDE 10 MEQ/100ML IV SOLN
10.0000 meq | INTRAVENOUS | Status: AC
  Administered 2014-02-28 (×2): 10 meq via INTRAVENOUS
  Filled 2014-02-28 (×2): qty 100

## 2014-02-28 MED ORDER — ONDANSETRON HCL 4 MG/2ML IJ SOLN
INTRAMUSCULAR | Status: AC
  Administered 2014-02-28: 4 mg via INTRAVENOUS
  Filled 2014-02-28: qty 2

## 2014-02-28 MED ORDER — IOHEXOL 300 MG/ML  SOLN
100.0000 mL | Freq: Once | INTRAMUSCULAR | Status: AC | PRN
  Administered 2014-02-28: 100 mL via INTRAVENOUS

## 2014-02-28 MED ORDER — METOPROLOL TARTRATE 1 MG/ML IV SOLN
2.5000 mg | INTRAVENOUS | Status: DC | PRN
  Filled 2014-02-28 (×2): qty 5

## 2014-02-28 NOTE — Progress Notes (Signed)
Chaplain introduced herself to pt wife and daughter. Pt wife asked for information regarding advanced directive (AD) after conversation with MD. Lunette Standshaplain gave pt wife AD packet and information about discussing it with her husband. Chaplain will continue to follow with family.   02/28/14 1300  Clinical Encounter Type  Visited With Family  Visit Type Follow-up  Referral From Chaplain  Spiritual Encounters  Spiritual Needs Emotional  Stress Factors  Family Stress Factors Health changes  Advance Directives (For Healthcare)  Does patient have an advance directive? No  Would patient like information on creating an advanced directive? Yes - Educational materials given  Jiles HaroldStamey, Lillyian Heidt F, Chaplain 02/28/2014 1:30 PM

## 2014-02-28 NOTE — Progress Notes (Signed)
Rehab Admissions Coordinator Note:  Patient was screened by Shabre Kreher L for appropriateness for an Inpatient Acute Rehab Consult.  At this time, we are recommending Inpatient Rehab consult.  Shaurya Rawdon L 02/28/2014, 2:41 PM  I can be reached at [phone].

## 2014-02-28 NOTE — Progress Notes (Signed)
PCCM Interval Progress Note  Called by RN because pt hypertensive with SBP as high as 190's.  No PRN meds ordered.  BP 142/70  Pulse 78  Temp(Src) 98.2 F (36.8 C) (Oral)  Resp 12  Ht 5\' 9"  (1.753 m)  Wt 68.7 kg (151 lb 7.3 oz)  BMI 22.36 kg/m2  SpO2 92%   Will order Lopressor PRN to maintain SBP < 160.   Rutherford Guysahul Zeba Luby, PA - [facility] Pgr: [phone]  or [phone]

## 2014-02-28 NOTE — Progress Notes (Signed)
25ml Omnipaque 300 in water given.

## 2014-02-28 NOTE — Progress Notes (Addendum)
PULMONARY / CRITICAL CARE MEDICINE   Name: Arthur Lawrence MRN: [id number] DOB: 1951-01-30    ADMISSION DATE:  02/23/2014 CONSULTATION DATE:  02/27/14  REFERRING MD :  Dr. Benjamine Mola   CHIEF COMPLAINT:  AMS, Vomiting   INITIAL PRESENTATION: 63 y/o AAM with PMH of HTN & DM who presented as a code stroke on 10/12 and had BRBPR.  CT head negative.  Pt underwent EGD & Colonoscopy on the evening of 10/12.  Post procedure, the patient later developed vomiting and decreased mental status.  10/13 MRI completed with concerns for watershed stroke.    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 10/13  MRI / MRA Head >> numerous punctate & patchy supratentorial infarcts bilaterally, small infarct inferior L pons 10/13  EEG >> normal EEG is consistent with a mild generalized non-specific cerebral dysfunction (encephalopathy). There was no seizure recorded on this study   SIGNIFICANT EVENTS: 10/12  Admit with near syncope, BRBPR, weakness on L.  Neg CT Head.  EGD & Colonoscopy performed.   10/13 Pontine infarct, transfer to ICU due to lethargy.  SUBJECTIVE: No events overnight, cough in tact.  VITAL SIGNS: Temp:  [98 F (36.7 C)-99.3 F (37.4 C)] 98.3 F (36.8 C) (10/14 0312) Pulse Rate:  [84-132] 85 (10/14 0400) Resp:   [4-27] 24 (10/14 0400) BP: (108-192)/(53-115) 150/72 mmHg (10/14 0400) SpO2:  [97 %-100 %] 100 % (10/14 0400) Weight:  [68.7 kg (151 lb 7.3 oz)] 68.7 kg (151 lb 7.3 oz) (10/13 0900)  HEMODYNAMICS:    VENTILATOR SETTINGS:    INTAKE / OUTPUT:  Intake/Output Summary (Last 24 hours) at 02/28/14 0640 Last data filed at 02/28/14 0400  Gross per 24 hour  Intake 1653.84 ml  Output   1900 ml  Net -246.16 ml   PHYSICAL EXAMINATION: General:  wdwn adult male in NAD  Neuro: Awake and interactive, moving all ext to command but with depressed mood. HEENT:  Pink/moist, good dentition, no jvd  Cardiovascular:  s1s2 rrr, no m/r/g Lungs:  resp's even/non-labored, lungs bilaterally clear  Abdomen:  Round/soft, bsx4 active  Musculoskeletal:  No acute deformities  Skin:  Warmd/dry, no edema   LABS:  CBC  Recent Labs Lab 02/27/14 0224 02/27/14 1007 02/27/14 1830 02/28/14 0245  WBC 15.3* 18.1*  --  17.0*  HGB 9.3*  9.3* 10.1* 9.4* 8.3*  HCT 26.5*  25.8* 28.0* 26.2* 23.7*  PLT 125* 99*  --  99*   Coag's  Recent Labs Lab 03/15/2014 0934 02/27/14 1007  APTT 25  --   INR 1.15 1.18   BMET  Recent Labs Lab 03/07/2014 0934 02/23/2014 0953 02/27/14 0224 02/28/14 0245  NA 141 141 142 151*  K 4.8 4.7 4.9 3.5*  CL 106 109 112 117*  CO2 19  --  13* 23  BUN 31* 29* 39* 35*  CREATININE 1.28 1.30 1.86*  1.25  GLUCOSE 323* 323* 316* 103*   Electrolytes  Recent Labs Lab 08-17-2013 0934 02/27/14 0224 02/28/14 0245  CALCIUM 8.7 7.0* 7.3*   Sepsis Markers  Recent Labs Lab 02/27/14 0224  LATICACIDVEN 4.5*   ABG  Recent Labs Lab 08-17-2013 2356 02/27/14 0423  PHART 7.459* 7.431  PCO2ART 14.4* 21.0*  PO2ART 86.7 131.0*   Liver Enzymes  Recent Labs Lab 08-17-2013 0934 02/27/14 0224  AST 59* 39*  ALT 66* 44  ALKPHOS 70 42  BILITOT 0.6 1.0  ALBUMIN 3.0* 2.2*   Cardiac Enzymes No results found for this basename: TROPONINI, PROBNP,  in the last 168  hours Glucose  Recent Labs Lab 02/27/14 1500 02/27/14 1557 02/27/14 1717 02/27/14 2012 02/27/14 2313 02/28/14 0311  GLUCAP 148* 134* 131* 124* 124* 95   Imaging Ct Angio Head W/cm &/or Wo Cm  02/27/2014   CLINICAL DATA:  Unresponsive, lethargy.  EXAM: CT ANGIOGRAPHY HEAD  TECHNIQUE: Multidetector CT imaging of the head was performed using the standard protocol during bolus administration of intravenous contrast. Multiplanar CT image reconstructions and MIPs were obtained to evaluate the vascular anatomy.  CONTRAST:  50 cc Omnipaque 300  COMPARISON:  CT angiogram of the head February 26, 2014  FINDINGS: CT head:  The ventricles and sulci are normal for age. No intraparenchymal hemorrhage, mass effect nor midline shift. Confluent supratentorial white matter hypodensities are wit similar. Patchy pontine hypodensity. No acute large vascular territory infarcts.  No abnormal extra-axial fluid collections. Basal cisterns are patent. Moderate calcific atherosclerosis of the carotid siphons.  No skull fracture. The included ocular globes and orbital contents are non-suspicious. The mastoid aircells and included paranasal sinuses are well-aerated.  No abnormal parenchymal nor extra-axial enhancement.  CTA head:  Anterior circulation: Normal appearance of the cervical internal carotid arteries, petrous, cavernous and supra clinoid internal carotid arteries. Mild calcific atherosclerosis of the carotid siphons. Widely patent anterior communicating artery. Normal appearance of the anterior and middle cerebral arteries. Early LEFT middle cerebral artery bifurcation, minimal irregularity of the M2 segments.  Posterior circulation: Codominant vertebral arteries with normal appearance of the vertebral arteries, vertebrobasilar junction and basilar artery, as well as main branch vessels. Mild calcific atherosclerosis. Patent of the posterior cerebral arteries, minimal luminal irregularity.  No large vessel occlusion,  hemodynamically significant stenosis, dissection, contrast extravasation or aneurysm within the anterior nor posterior circulation.  Review of the MIP images confirms the above findings.  IMPRESSION: CT head: Stable appearance of the head: Severe white matter changes suggest chronic small vessel ischemic disease. Stable pontine infarct.  CTA head: No acute vascular process or hemodynamically significant stenosis.  Mild irregularity of the intracranial vessels most consistent with atherosclerosis.   Electronically Signed   By: Awilda Metroourtnay  Bloomer   On: 02/27/2014 02:43   Mr Brain Wo Contrast  02/27/2014   CLINICAL DATA:  63 year old diabetic hypertensive male with hyperlipidemia and hepatitis-C presenting with lethargy and unresponsiveness. Initial encounter.  EXAM: MRI HEAD WITHOUT CONTRAST  MRA HEAD WITHOUT CONTRAST  TECHNIQUE: Multiplanar, multiecho pulse sequences of the brain and surrounding structures were obtained without intravenous contrast. Angiographic images of the head were obtained using MRA technique without contrast.  COMPARISON:  06-27-2013 CT and CT angiogram.  FINDINGS: MRI HEAD FINDINGS  Exam is motion degraded.  Numerous punctate and patchy supratentorial infarcts bilaterally. Acute small nonhemorrhagic infarct inferior left pons. Findings raise possibility of embolic disease.  Remote partially hemorrhagic left thalamic and right pontine infarct. Remote left pontine infarct.  Moderate small  vessel disease type changes.  No intracranial mass lesion noted on this unenhanced exam.  Mild global atrophy without hydrocephalus  Mild exophthalmos.  MRA HEAD FINDINGS  Secondary to motion degradation, it is difficult to evaluate for aneurysm or adequately grade stenosis.  Flow is noted within medium and large size intracranial vessels.  Limited evaluation of branch vessels particularly right posterior inferior cerebellar artery.  IMPRESSION: MRI HEAD:  Exam is motion degraded.  Numerous punctate and  patchy supratentorial infarcts bilaterally. Acute small nonhemorrhagic infarct inferior left pons. Findings raise possibility of embolic disease.  Remote partially hemorrhagic left thalamic and right pontine infarct. Remote left pontine infarct.  Moderate small vessel disease type changes.  MRA HEAD:  Secondary to motion degradation, it is difficult to evaluate for aneurysm or adequately grade stenosis.  Flow is noted within medium and large size intracranial vessels.  Limited evaluation of branch vessels particularly right posterior inferior cerebellar artery.  These results will be called to the ordering clinician or representative by the Radiologist Assistant, and communication documented in the PACS or zVision Dashboard.   Electronically Signed   By: Bridgett LarssonSteve  Olson M.D.   On: 02/27/2014 07:55   Dg Chest Port 1 View  02/27/2014   CLINICAL DATA:  Aspiration of gastric contents.  Initial encounter.  EXAM: PORTABLE CHEST - 1 VIEW  COMPARISON:  02/01/2009  FINDINGS: Normal heart size and mediastinal contours. No acute infiltrate or edema. No effusion or pneumothorax. No acute osseous findings.  IMPRESSION: No findings to suggest aspiration or pneumonia.   Electronically Signed   By: Tiburcio PeaJonathan  Watts M.D.   On: 02/27/2014 03:02   Mr Maxine GlennMra Head/brain Wo Cm  02/27/2014   CLINICAL DATA:  63 year old diabetic hypertensive male with hyperlipidemia and hepatitis-C presenting with lethargy and unresponsiveness. Initial encounter.  EXAM: MRI HEAD WITHOUT CONTRAST  MRA HEAD WITHOUT CONTRAST  TECHNIQUE: Multiplanar, multiecho pulse sequences of the brain and surrounding structures were obtained without intravenous contrast. Angiographic images of the head were obtained using MRA technique without contrast.  COMPARISON:  02/20/2014 CT and CT angiogram.  FINDINGS: MRI HEAD FINDINGS  Exam is motion degraded.  Numerous punctate and patchy supratentorial infarcts bilaterally. Acute small nonhemorrhagic infarct inferior left pons.  Findings raise possibility of embolic disease.  Remote partially hemorrhagic left thalamic and right pontine infarct. Remote left pontine infarct.  Moderate small vessel disease type changes.  No intracranial mass lesion noted on this unenhanced exam.  Mild global atrophy without hydrocephalus  Mild exophthalmos.  MRA HEAD FINDINGS  Secondary to motion degradation, it is difficult to evaluate for aneurysm or adequately grade stenosis.  Flow is noted within medium and large size intracranial vessels.  Limited evaluation of branch vessels particularly right posterior inferior cerebellar artery.  IMPRESSION: MRI HEAD:  Exam is motion degraded.  Numerous punctate and patchy supratentorial infarcts bilaterally. Acute small nonhemorrhagic infarct inferior left pons. Findings raise possibility of embolic disease.  Remote partially hemorrhagic left thalamic and right pontine infarct. Remote left pontine infarct.  Moderate small vessel disease type changes.  MRA HEAD:  Secondary to motion degradation, it is difficult to evaluate for aneurysm or adequately grade stenosis.  Flow is noted within medium and large size intracranial vessels.  Limited evaluation of branch vessels particularly right posterior inferior cerebellar artery.  These results will be called to the ordering clinician or representative by the Radiologist Assistant, and communication documented in the PACS or zVision Dashboard.   Electronically Signed   By: Kandice HamsSteve  Olson M.D.  On: 02/27/2014 07:55   ASSESSMENT / PLAN:  NEUROLOGIC A:   CVA - with brainstem involvement per CT Acute Encephalopathy - in setting of CVA P:   Neurology following Hold in the ICU for observation today, may transfer in AM if remains stable Swallow evaluation Neuro checks Further imaging per Neurology   PULMONARY OETT  A: At Risk Respiratory Failure, Aspiration - in the setting of CVA P:   Raise HOB Aspiration precautions O2 to support sats > 92% Trend CXR    CARDIOVASCULAR CVL A:  Hypertension  Tachycardia - in the setting of volume loss  P:  Assess ECHO, pending Tele monitoring BP control per neuro recommendations  RENAL A:   Acute Kidney Injury Elevated Lactate  Hyperkalemia  Compensated Metabolic Acidosis - likely non-gap component with GI loss P:   Trend BMP  Ensure adequate perfusion, control BP Strict I/O's, foley catheter Sodium bicarbonate gtt for hyperchloremia Encourage PO for hypernatremia  GASTROINTESTINAL A:   Acute Diverticular Bleed Nausea / Vomiting  Hepatitis C P:   Swallow evaluation and diet per speech recommendations PPI x 1 GI Following, appreciate input  HEMATOLOGIC A:   Anemia  Thrombocytopenia  P:  Trend CBC Tx for Hgb <7%, active bleeding or MI < 8%  INFECTIOUS A:   Leukocytosis  At Risk for Aspiration  P:   Monitor fever curve / leukocytosis  ENDOCRINE A:   Hyperglycemia   P:   SSI   Family updated:  Interdisciplinary Family Meeting v Palliative Care Meeting:   Acute CVA, less lethargic this AM but airway protection remains questionable, speech pathology to see, ambulate, OOB to chair and PT.  BP control per neuro recommendations.  Patient is mentally intact and wife if bedside.  Discussed code status, patient does not wish for CPR/cardioversion/intubation, will make limited code status.  Hold in observation overnight and if stable in AM will transfer out to SDU and back to [facility].  Continue bicarb drip for now.  I have personally obtained a history, examined the patient, evaluated laboratory and imaging results, formulated the assessment and plan and placed orders.  CRITICAL CARE: The patient is critically ill with multiple organ systems failure and requires high complexity decision making for assessment and support, frequent evaluation and titration of therapies, application of advanced monitoring technologies and extensive interpretation of multiple databases. Critical Care Time  devoted to patient care services described in this note is  minutes.   PCCM ATTENDING: I have interviewed and examined the patient and reviewed the database. I have formulated the assessment and plan as reflected in the note above with amendments made by me.   Moved to ICU for closer monitoring of neuro and airway statuses. Presently somnolent but arousable and following commands without obvious focal deficits. Stroke team feels this is likely embolic in nature rather than watershed infarcts. PCCM will be primary service while in ICU  Billy Fischer, MD;  PCCM service; Mobile [phone]    02/28/2014, 6:40 AM

## 2014-02-28 NOTE — Progress Notes (Signed)
STROKE TEAM PROGRESS NOTE   HISTORY Arthur Lawrence is an 63 y.o. male who reports that he awakened early this morning and went to the bathroom. He noted some blood in his stools but was able to go back to sleep. When he awakened again about 30 minutes later he had more blood in hid stools but noted on getting up that he was dizzy and had some left sided weakness. He had two more bouts of bloody stools. Family also noted some confusion and EMS was called. Code stroke was called in the ED.  Date last known well: Date: 07-28-13  Time last known well: Time: 05:30  tPA Given: No: Active GI bleed, resolution of symptoms  SUBJECTIVE (INTERVAL HISTORY) Speech pathologist was with him at the bedside for swallow eval. He did well with swallow but flat affect. His venous doppler was negative. Will do pan CT to rule out malignancy. Will schedule for TEE and loop. His Hb down trending but no bloody stool overnight.   OBJECTIVE Temp:  [98 F (36.7 C)-99 F (37.2 C)] 98.2 F (36.8 C) (10/14 1152) Pulse Rate:  [78-112] 78 (10/14 1100) Cardiac Rhythm:  [-] Sinus tachycardia;Normal sinus rhythm (10/14 0800) Resp:  [4-24] 12 (10/14 1100) BP: (109-176)/(59-85) 142/70 mmHg (10/14 1100) SpO2:  [92 %-100 %] 92 % (10/14 1100)   Recent Labs Lab 02/27/14 2012 02/27/14 2313 02/28/14 0311 02/28/14 0826 02/28/14 1149  GLUCAP 124* 124* 95 112* 128*    Recent Labs Lab 05-07-2014 0934 05-07-2014 0953 02/27/14 0224 02/28/14 0245  NA 141 141 142 151*  K 4.8 4.7 4.9 3.5*  CL 106 109 112 117*  CO2 19  --  13* 23  GLUCOSE 323* 323* 316* 103*  BUN 31* 29* 39* 35*  CREATININE 1.28 1.30 1.86* 1.25  CALCIUM 8.7  --  7.0* 7.3*    Recent Labs Lab 05-07-2014 0934 02/27/14 0224  AST 59* 39*  ALT 66* 44  ALKPHOS 70 42  BILITOT 0.6 1.0  PROT 6.0 4.3*  ALBUMIN 3.0* 2.2*    Recent Labs Lab 05-07-2014 0934  02/27/14 0224 02/27/14 1007 02/27/14 1830 02/28/14 0245 02/28/14 0932  WBC 13.2*  --  15.3* 18.1*   --  17.0*  --   NEUTROABS 10.2*  --   --   --   --   --   --   HGB 10.5*  < > 9.3*  9.3* 10.1* 9.4* 8.3* 8.8*  HCT 30.6*  < > 26.5*  25.8* 28.0* 26.2* 23.7* 25.1*  MCV 89.5  --  88.1 85.1  --  85.6  --   PLT 236  --  125* 99*  --  99*  --   < > = values in this interval not displayed. No results found for this basename: CKTOTAL, CKMB, CKMBINDEX, TROPONINI,  in the last 168 hours  Recent Labs  05-07-2014 0934 02/27/14 1007  LABPROT 14.7 15.0  INR 1.15 1.18    Recent Labs  05-07-2014 1323  COLORURINE YELLOW  LABSPEC >1.046*  PHURINE 5.5  GLUCOSEU >1000*  HGBUR NEGATIVE  BILIRUBINUR NEGATIVE  KETONESUR 15*  PROTEINUR NEGATIVE  UROBILINOGEN 0.2  NITRITE NEGATIVE  LEUKOCYTESUR NEGATIVE       Component Value Date/Time   CHOL 80 02/28/2014 0245   Lab Results  Component Value Date   HGBA1C 5.7* 02/28/2014      Component Value Date/Time   LABOPIA NONE DETECTED 07-28-13 1323   COCAINSCRNUR NONE DETECTED 07-28-13 1323   LABBENZ NONE DETECTED  Nov 27, 2013 1323   AMPHETMU NONE DETECTED Nov 27, 2013 1323   THCU NONE DETECTED Nov 27, 2013 1323   LABBARB NONE DETECTED Nov 27, 2013 1323     Recent Labs Lab 23-May-2013 0934  ETH <11    Ct Angio Head W/cm &/or Wo Cm 02/27/2014   IMPRESSION: CT head: Stable appearance of the head: Severe white matter changes suggest chronic small vessel ischemic disease. Stable pontine infarct.  CTA head: No acute vascular process or hemodynamically significant stenosis.  Mild irregularity of the intracranial vessels most consistent with atherosclerosis.     Ct Angio Head and neck W/cm &/or Wo Cm Nov 27, 2013   IMPRESSION: 1. Age indeterminate infarcts involving the brainstem. 2. No significant supratentorial infarct. 3. 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 The vertebral arteries are codominant distally. 4. Minimal atherosclerotic changes at the carotid bifurcations bilaterally without significant stenosis. 5.  Atherosclerotic changes within the cavernous carotid arteries bilaterally without significant stenosis. 6. Mild distal small vessel disease.    Ct Head Wo Contrast Nov 27, 2013   IMPRESSION: Age advanced chronic microvascular changes throughout the deep white matter.  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     Mri and Mra Brain Wo Contrast 02/27/2014   IMPRESSION: MRI HEAD:  Exam is motion degraded.  Numerous punctate and patchy supratentorial infarcts bilaterally. Acute small nonhemorrhagic infarct inferior left pons. Findings raise possibility of embolic disease.  Remote partially hemorrhagic left thalamic and right pontine infarct. Remote left pontine infarct.  Moderate small vessel disease type changes.  MRA HEAD:  Secondary to motion degradation, it is difficult to evaluate for aneurysm or adequately grade stenosis.  Flow is noted within medium and large size intracranial vessels.  Limited evaluation of branch vessels particularly right posterior inferior cerebellar artery.    Dg Chest Port 1 View 02/27/2014  IMPRESSION: No findings to suggest aspiration or pneumonia.   E  2D echo - - Normal LV function; grade 1 diastolic dysfunction; trace TR. EF 60-65%  EEG - EEG Abnormalities: 1) Mild generalized irregular delta activity.  Clinical Interpretation: This normal EEG is consistent with a  mild generalized non-specific cerebral  dysfunction(encephalopathy). There was no seizure or seizure  predisposition recorded on this study.   Venous doppler -  Bilateral upper extremity venous duplex= No evidence of DVT bilaterally. Superficial thrombosis is noted in the [facility]ilic [facility]  Bilateral lower extremity venous duplex= No evidence of DVT bilaterally.  Pan CT - pending  TEE - pending  Hypercoagulable work up - pending  LDL 22 and A1C 5.7  PHYSICAL EXAM  Temp:  [98 F (36.7 C)-99 F (37.2 C)] 98.2 F (36.8 C) (10/14 1152) Pulse Rate:  [78-112] 78 (10/14 1100) Resp:   [4-24] 12 (10/14 1100) BP: (109-176)/(59-85) 142/70 mmHg (10/14 1100) SpO2:  [92 %-100 %] 92 % (10/14 1100)  General - Well nourished, well developed, in no acute distress.  Ophthalmologic - not cooperative on exam.  Cardiovascular - Regular rhythm with no murmur.  Mental Status -  Awake alert and orientated x 3, does not know why he is in the hospital.  Flat affect, paucity of language, and moderate psychomotor slowing. Able to follow simple commands, can name objects 2/3 but not able to name parts of objects. Repetition preserved.   Cranial Nerves II - XII - II - blink bilaterally to visual threat. III, IV, VI - Extraocular movements intact. V - Facial sensation intact bilaterally. VII - right nasolabial fold flattening. VIII - Hearing & vestibular  intact bilaterally. X - Palate elevates symmetrically. XI - Chin turning & shoulder shrug intact bilaterally. XII - Tongue protrusion intact.  Motor Strength - The patient's strength was 4/5 BUEs and pronator drift was absent. spontaneously moving BLEs but LLE increased muscle tone with gegenhalten type paratonia. Bulk was normal and fasciculations were absent.    Reflexes - The patient's reflexes were 1+ in all extremities and he had positive right babinski but mute left babinski.  Sensory - Light touch, temperature/pinprick were assessed and were symmetrical.    Coordination - The patient had normal movements in the hands with no ataxia or dysmetria.  Tremor was absent.  Gait and Station - not tested  ASSESSMENT/PLAN Arthur Lawrence is a 63 y.o. male with history of HTN, DM presenting with lower GI bleeding. His last colonoscopy was 2004 when ployp removed and biopsy showed benign. He developed slurry speech and left hemiparesis but improved in ER. He did not receive IV t-PA due to GI bleeding and improving symptoms. However, he developed AMS overnight and MRI showed acute pontine stroke with embolic shower at anterior circulation  with multiple old lacunar pontine hemorrhage and infarct.   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 Less likely due to hypotension as the stroke distribution is not at watershed pattern.  MRI showed small vessel events including acute and chronic pontine hemorrhage and infarct as well as cardioembolic pattern with anterior circulation embolic shower  CTA head and neck - no significant large vessel stenosis  Venous doppler no DVT seen  TEE pending   Pan CT pending  2D Echo  unremarkable  LDL 22, within the goal  HgbA1c 5.7, within the goal  SCDs for VTE prophylaxis  Clear Liquid   Resultant psychomotor slowing and paucity of speech  aspirin 81 mg orally every day prior to admission, now on no antithrombotics  Risk factor education  Ongoing aggressive risk factor management  Hypertension  Home meds: lisinopril  Stable at 140-160 now  LGIB - GI on board - currently bleeding stopped - s/p 4 x PRBC transfusion - H&H still down trending - concerning for malignancy.  - CEA normal range  Other Stroke Risk Factors Advanced age Former cigarette smoker, quit 40 years   Family hx stroke (mom)  Other Active Problems  High Cre - likely contrast related - on bicarb and down trending   Hospital day # 2  Marvel Plan, MD PhD Stroke Neurology 02/28/2014 1:34 PM    To contact Stroke Continuity provider, please refer to [email or web address]. After hours, contact General Neurology

## 2014-02-28 NOTE — Progress Notes (Signed)
No evident bleeding, per discussion with patient, sitter, and covering nurse.  Hemoglobin stable.  Mental status much improved compared to yesterday.  Impression:  1. Pontine stroke, recovering 2. Diverticular bleed, quiescent 3. Posthemorrhagic anemia, acute, stable  Plan:  1. I will sign off at this time. Please call if I can be of further assistance in this patient's care. There is approximately a 25% chance of rebleeding within the next couple of days. In that case, interventional radiology would probably be our best bet. 2. From the GI tract standpoint, the patient can have his diet advanced at any time, as much as his neurologic circumstances permit.  Florencia Reasonsobert V. Lyndsy Gilberto, M.D. [phone]

## 2014-02-28 NOTE — Progress Notes (Signed)
25ml Omnipaque 300 in water given. Will notify CT. Continuing to monitor.

## 2014-02-28 NOTE — Progress Notes (Signed)
[facility] LAB REPLACEMENT ONLY  The patient does [facility] based on the criteria listed below:   1. Is GFR >/= 40 ml/min? Yes.    Patient's GFR today is 60 2. Is urine output >/= 0.5 ml/kg/hr for the last 6 hours? Yes.   Patient's UOP is 1.03 ml/kg/hr 3. Is BUN < 60 mg/dL? Yes.    Patient's BUN today is 35 4. Abnormal electrolyte  K 3.5 5. Ordered repletion with: per protocol 6. If a panic level lab has been reported, has the CCM MD in charge been notified? Yes.  .   Physician:  Gillis SantaAlva  Sandy Haye McEachran 02/28/2014 5:05 AM

## 2014-02-28 NOTE — Progress Notes (Signed)
Rutherford Guysahul Desai, PA-C made aware patient SBP 140-170's this morning with no PRNs. Orders received. Also made aware PT recommended cognition evaluation.

## 2014-02-28 NOTE — Clinical Documentation Improvement (Signed)
Possible Clinical Conditions?   Septicemia / Sepsis Severe Sepsis Neutropenic Sepsis  SIRS Septic Shock Sepsis with UTI  Bacterial infection of unknown etiology / source Other Condition  Cannot clinically Determine    Risk Factors: 10/13: lactic acid: 4.5.   Thank You, Marciano SequinWanda Mathews-Bethea,RN,BSN, Clinical Documentation Specialist:  [phone]  [facility]- [facility] Information Management

## 2014-02-28 NOTE — Progress Notes (Signed)
*  PRELIMINARY RESULTS* Vascular Ultrasound Bilateral upper extremity venous duplex= No evidence of DVT bilaterally. Superficial [facility] Bilateral lower extremity venous duplex= No evidence of DVT bilaterally.  Farrel DemarkJill Eunice, RDMS, RVT  02/28/2014, 12:03 PM

## 2014-02-28 NOTE — Evaluation (Signed)
Physical Therapy Evaluation Patient Details Name: Arthur HummerRonald B Adorno MRN: [id number] DOB: December 13, 1950 Today's Date: 02/28/2014   History of Present Illness  63 y/o AAM with PMH of HTN & DM who presented as a code stroke on 10/12 and had BRBPR. CT head negative. Pt underwent EGD & Colonoscopy on the evening of 10/12. Post procedure, the patient later developed vomiting and decreased mental status. 10/13 MRI completed  with watershed strokes (punctate and supratentorial infarcts bilaterally as well as inferior L pons) 10/13 EEG + for encephalopathy.   Clinical Impression  Patient demonstrates deficits in functional mobility as indicated below. Will benefit from continued skilled PT to address deficits and maximize function. Will see as indicated and progress as tolerated. Will need CIR upon acute discharge.    Follow Up Recommendations CIR    Equipment Recommendations  Other (comment) (TBD)    Recommendations for Other Services Rehab consult     Precautions / Restrictions Precautions Precautions: Fall      Mobility  Bed Mobility Overal bed mobility: Needs Assistance Bed Mobility: Supine to Sit     Supine to sit: Min assist        Transfers Overall transfer level: Needs assistance Equipment used: 2 person hand held assist Transfers: Sit to/from BJ'sStand;Stand Pivot Transfers Sit to Stand: Max assist;+2 physical assistance Stand pivot transfers: Max assist;+2 physical assistance       General transfer comment: Patient unable to support weight, unable to coordinate steps for SPT to chair, increased assist required.  Ambulation/Gait                Stairs            Wheelchair Mobility    Modified Rankin (Stroke Patients Only) Modified Rankin (Stroke Patients Only) Pre-Morbid Rankin Score: No symptoms Modified Rankin: Severe disability     Balance Overall balance assessment: Needs assistance Sitting-balance support: Feet supported Sitting balance-Leahy  Scale: Fair     Standing balance support: During functional activity Standing balance-Leahy Scale: Poor                               Pertinent Vitals/Pain Pain Assessment: No/denies pain    Home Living Family/patient expects to be discharged to:: Private residence Living Arrangements: Spouse/significant other Available Help at Discharge: Family Type of Home: House       Home Layout: One level Home Equipment: None      Prior Function Level of Independence: Independent               Hand Dominance   Dominant Hand: Right    Extremity/Trunk Assessment               Lower Extremity Assessment: Generalized weakness;Difficult to assess due to impaired cognition (3/5 gross movements, unable to follow commands for testing)         Communication   Communication: No difficulties  Cognition Arousal/Alertness: Awake/alert Behavior During Therapy: WFL for tasks assessed/performed Overall Cognitive Status: Impaired/Different from baseline Area of Impairment: Orientation;Attention;Memory;Following commands;Safety/judgement;Awareness;Problem solving Orientation Level: Disoriented to;Situation Current Attention Level: Sustained Memory: Decreased short-term memory Following Commands: Follows one step commands consistently;Follows one step commands with increased time;Follows multi-step commands inconsistently (could not perform 2 or 3 step task) Safety/Judgement: Decreased awareness of safety;Decreased awareness of deficits (believes he is safe to get up without assist (req +2)) Awareness: Intellectual Problem Solving: Slow processing;Requires verbal cues;Requires tactile cues General Comments: Patient appears initially in tact with  ability to converse and follow simple commands, however, patient was unable to follow 2 to 3 stap basic commands, unable to recognize that his legs can not support his weight or coordiate movement safely even after cueing him  regarding how much assistance he required he achknowledged but still had no recognition of need for assist.    General Comments      Exercises        Assessment/Plan    PT Assessment Patient needs continued PT services  PT Diagnosis Difficulty walking;Abnormality of gait;Altered mental status   PT Problem List Decreased strength;Decreased range of motion;Decreased activity tolerance;Decreased balance;Decreased mobility;Decreased coordination;Decreased cognition;Decreased safety awareness  PT Treatment Interventions DME instruction;Gait training;Functional mobility training;Therapeutic activities;Therapeutic exercise;Balance training;Patient/family education   PT Goals (Current goals can be found in the Care Plan section) Acute Rehab PT Goals Patient Stated Goal: per wife to get back to independent PT Goal Formulation: With patient/family Time For Goal Achievement: 03/14/14 Potential to Achieve Goals: Fair    Frequency Min 3X/week   Barriers to discharge        Co-evaluation               End of Session Equipment Utilized During Treatment: Gait belt Activity Tolerance: Patient tolerated treatment well Patient left: in chair;with call bell/phone within reach;with family/visitor present Nurse Communication: Mobility status         Time: [id number] PT Time Calculation (min): 22 min   Charges:   PT Evaluation $Initial PT Evaluation Tier I: 1 Procedure PT Treatments $Therapeutic Activity: 8-22 mins   PT G CodesFabio Asa:          Anival Pasha J 02/28/2014, 1:19 PM  Charlotte Crumbevon Anvith Mauriello, PT DPT  [phone]

## 2014-02-28 NOTE — Evaluation (Signed)
Clinical/Bedside Swallow Evaluation Patient Details  Name: Arthur Lawrence MRN: [id number] Date of Birth: Sep 20, 1950  Today's Date: 02/28/2014 Time: [id number] SLP Time Calculation (min): 25 min  Past Medical History:  Past Medical History  Diagnosis Date  . Hypertension   . Allergic rhinitis   . Hyperlipidemia due to type 1 diabetes mellitus   . Type II diabetes mellitus   . History of blood transfusion 1976; 03/06/2014    "work accident; LGIB"  . Hepatitis C   . Acute lower GI bleeding 02/22/2014    "stemming from diverticulosis"   Past Surgical History:  Past Surgical History  Procedure Laterality Date  . Laparoscopic cholecystectomy  2013  . Esophagogastroduodenoscopy N/A 03/15/2014    Procedure: ESOPHAGOGASTRODUODENOSCOPY (EGD);  Surgeon: Florencia Reasonsobert Buccini V, MD;  [facility];  Service: Endoscopy;  Laterality: N/A;  . Colonoscopy N/A 03/08/2014    Procedure: COLONOSCOPY;  Surgeon: Florencia Reasonsobert Buccini V, MD;  [facility];  Service: Endoscopy;  Laterality: N/A;   HPI:  63 y/o AAM with PMH of HTN & DM who presented as a code stroke on 10/12 and had BRBPR.  Pt underwent EGD & Colonoscopy on the evening of 10/12 (suspicion of diverticular hemorrhage). Post procedure, the patient later developed vomiting and decreased mental status. 10/13  MRI shows numerous punctate & patchy supratentorial infarcts bilaterally, small infarct inferior L pons. Wife reports occasaional coughing/choking with meals prior to admit.    Assessment / Plan / Recommendation Clinical Impression  Over 16 oz intake of water with pt self feeding, there was no indication of aspiration or intolerance. Concerned for silent asprition given site of lesion, however after so much water, would suspect late sensation of aspirate descending respiratory tract. Recommend pt initiate a thin, clear liquid diet per GI recs with close supervision from RN and family. SLP will follow for tolerance. Also requested  cognitive linguistic eval due to significantly flat affect in a typically dyanamic personality per wife.     Aspiration Risk  Mild    Diet Recommendation Thin liquid   Liquid Administration via: Cup;Straw Medication Administration: Whole meds with liquid Supervision: Patient able to self feed;Full supervision/cueing for compensatory strategies Postural Changes and/or Swallow Maneuvers: Seated upright 90 degrees    Other  Recommendations Oral Care Recommendations: Oral care BID   Follow Up Recommendations  24 hour supervision/assistance    Frequency and Duration min 2x/week  1 week   Pertinent Vitals/Pain NA    SLP Swallow Goals     Swallow Study Prior Functional Status       General HPI: 63 y/o AAM with PMH of HTN & DM who presented as a code stroke on 10/12 and had BRBPR.  Pt underwent EGD & Colonoscopy on the evening of 10/12 (suspicion of diverticular hemorrhage). Post procedure, the patient later developed vomiting and decreased mental status. 10/13  MRI shows numerous punctate & patchy supratentorial infarcts bilaterally, small infarct inferior L pons. Wife reports occasaional coughing/choking with meals prior to admit.  Type of Study: Bedside swallow evaluation Previous Swallow Assessment: none Diet Prior to this Study: NPO Temperature Spikes Noted: No Respiratory Status: Room air History of Recent Intubation: No Behavior/Cognition: Alert;Cooperative;Requires cueing Self-Feeding Abilities: Able to feed self Patient Positioning: Upright in bed Baseline Vocal Quality: Clear Volitional Cough: Strong Volitional Swallow: Able to elicit    Oral/Motor/Sensory Function Overall Oral Motor/Sensory Function: Appears within functional limits for tasks assessed   Ice Chips     Thin Liquid Thin Liquid: Within functional limits  Presentation: Cup;Straw;Self Fed    Nectar Thick     Honey Thick     Puree Puree: Within functional limits   Solid   GO    Solid: Within  functional limits      [facility] Medical CenterBonnie Errik Mitchelle, MA CCC-SLP [phone]  Arthur Lawrence, Arthur Lawrence 02/28/2014,9:15 AM

## 2014-03-01 ENCOUNTER — Encounter (HOSPITAL_COMMUNITY): Payer: Self-pay | Admitting: *Deleted

## 2014-03-01 ENCOUNTER — Encounter (HOSPITAL_COMMUNITY): Admission: EM | Disposition: E | Payer: Self-pay | Source: Home / Self Care | Attending: Internal Medicine

## 2014-03-01 DIAGNOSIS — R0600 Dyspnea, unspecified: Secondary | ICD-10-CM

## 2014-03-01 DIAGNOSIS — I341 Nonrheumatic mitral (valve) prolapse: Secondary | ICD-10-CM

## 2014-03-01 DIAGNOSIS — I10 Essential (primary) hypertension: Secondary | ICD-10-CM

## 2014-03-01 HISTORY — PX: TEE WITHOUT CARDIOVERSION: SHX5443

## 2014-03-01 LAB — BASIC METABOLIC PANEL
Anion gap: 9 (ref 5–15)
BUN: 15 mg/dL (ref 6–23)
CHLORIDE: 106 meq/L (ref 96–112)
CO2: 29 meq/L (ref 19–32)
Calcium: 7.4 mg/dL — ABNORMAL LOW (ref 8.4–10.5)
Creatinine, Ser: 0.91 mg/dL (ref 0.50–1.35)
GFR calc Af Amer: >90 mL/min (ref >90)
GFR calc non Af Amer: 88 mL/min — ABNORMAL LOW (ref >90)
Glucose, Bld: 94 mg/dL (ref 70–99)
Potassium: 3.2 mEq/L — ABNORMAL LOW (ref 3.7–5.3)
SODIUM: 144 meq/L (ref 137–147)

## 2014-03-01 LAB — GLUCOSE, CAPILLARY
GLUCOSE-CAPILLARY: 105 mg/dL — AB (ref 70–99)
GLUCOSE-CAPILLARY: 104 mg/dL — AB (ref 70–99)
GLUCOSE-CAPILLARY: 108 mg/dL — AB (ref 70–99)
GLUCOSE-CAPILLARY: 107 mg/dL — AB (ref 70–99)
GLUCOSE-CAPILLARY: 119 mg/dL — AB (ref 70–99)
Glucose-Capillary: 124 mg/dL — ABNORMAL HIGH (ref 70–99)
Glucose-Capillary: 96 mg/dL (ref 70–99)

## 2014-03-01 LAB — CBC
HCT: 22.1 % — ABNORMAL LOW (ref 39.0–52.0)
Hemoglobin: 7.8 g/dL — ABNORMAL LOW (ref 13.0–17.0)
MCH: 30.8 pg (ref 26.0–34.0)
MCHC: 35.3 g/dL (ref 30.0–36.0)
MCV: 87.4 fL (ref 78.0–100.0)
Platelets: 98 10*3/uL — ABNORMAL LOW (ref 150–400)
RBC: 2.53 MIL/uL — ABNORMAL LOW (ref 4.22–5.81)
RDW: 13.7 % (ref 11.5–15.5)
WBC: 10.3 10*3/uL (ref 4.0–10.5)

## 2014-03-01 LAB — PHOSPHORUS: PHOSPHORUS: 2 mg/dL — AB (ref 2.3–4.6)

## 2014-03-01 LAB — RHEUMATOID FACTOR: Rhuematoid fact SerPl-aCnc: <10 IU/mL (ref <=14)

## 2014-03-01 LAB — HEMATOCRIT
HCT: 22 % — ABNORMAL LOW (ref 39.0–52.0)
HCT: 23.9 % — ABNORMAL LOW (ref 39.0–52.0)
HEMATOCRIT: 24.8 % — AB (ref 39.0–52.0)

## 2014-03-01 LAB — HOMOCYSTEINE: HOMOCYSTEINE-NORM: 13.8 umol/L (ref 4.0–15.4)

## 2014-03-01 LAB — HEMOGLOBIN
Hemoglobin: 7.7 g/dL — ABNORMAL LOW (ref 13.0–17.0)
Hemoglobin: 8.3 g/dL — ABNORMAL LOW (ref 13.0–17.0)
Hemoglobin: 8.5 g/dL — ABNORMAL LOW (ref 13.0–17.0)

## 2014-03-01 LAB — MAGNESIUM: Magnesium: 2.1 mg/dL (ref 1.5–2.5)

## 2014-03-01 SURGERY — ECHOCARDIOGRAM, TRANSESOPHAGEAL
Anesthesia: Moderate Sedation

## 2014-03-01 MED ORDER — FENTANYL CITRATE 0.05 MG/ML IJ SOLN
INTRAMUSCULAR | Status: AC
  Filled 2014-03-01: qty 2

## 2014-03-01 MED ORDER — SODIUM PHOSPHATE 3 MMOLE/ML IV SOLN
10.0000 mmol | Freq: Once | INTRAVENOUS | Status: AC
  Administered 2014-03-01: 10 mmol via INTRAVENOUS
  Filled 2014-03-01: qty 3.33

## 2014-03-01 MED ORDER — MIDAZOLAM HCL 10 MG/2ML IJ SOLN
INTRAMUSCULAR | Status: DC | PRN
  Administered 2014-03-01 (×3): 1 mg via INTRAVENOUS

## 2014-03-01 MED ORDER — BUTAMBEN-TETRACAINE-BENZOCAINE 2-2-14 % EX AERO
INHALATION_SPRAY | CUTANEOUS | Status: DC | PRN
  Administered 2014-03-01: 2 via TOPICAL

## 2014-03-01 MED ORDER — POTASSIUM CHLORIDE 10 MEQ/100ML IV SOLN
10.0000 meq | INTRAVENOUS | Status: AC
  Administered 2014-03-01 (×4): 10 meq via INTRAVENOUS
  Filled 2014-03-01 (×4): qty 100

## 2014-03-01 MED ORDER — MIDAZOLAM HCL 5 MG/ML IJ SOLN
INTRAMUSCULAR | Status: AC
  Filled 2014-03-01: qty 2

## 2014-03-01 MED ORDER — SODIUM CHLORIDE 0.9 % IV SOLN: INTRAVENOUS | Status: DC

## 2014-03-01 NOTE — Progress Notes (Signed)
Johna Kearl, PT DPT  [phone]  

## 2014-03-01 NOTE — Progress Notes (Signed)
PULMONARY / CRITICAL CARE MEDICINE   Name: Arthur Lawrence MRN: [id number] DOB: 12/13/1950    ADMISSION DATE:  02/28/2014 CONSULTATION DATE:  02/27/14  REFERRING MD :  Dr. Benjamine MolaVann   CHIEF COMPLAINT:  AMS, Vomiting   INITIAL PRESENTATION: 63 y/o AAM with PMH of HTN & DM who presented as a code stroke on 10/12 and had BRBPR.  CT head negative.  Pt underwent EGD & Colonoscopy on the evening of 10/12.  Post procedure, the patient later developed vomiting and decreased mental status.  10/13 MRI completed with concerns for watershed stroke.    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 10/13  MRI / MRA Head >> numerous punctate & patchy supratentorial infarcts bilaterally, small infarct inferior L pons 10/13  EEG >> normal EEG is consistent with a mild generalized non-specific cerebral dysfunction (encephalopathy). There was no seizure recorded on this study  SIGNIFICANT EVENTS: 10/12  Admit with near syncope, BRBPR, weakness on L.  Neg CT Head.  EGD & Colonoscopy performed.   10/13 Pontine infarct, transfer to ICU due to lethargy.  SUBJECTIVE: No events overnight, cough in tact.  VITAL SIGNS: Temp:  [98.1 F (36.7 C)-99.7 F (37.6 C)] 98.9 F (37.2 C) (10/15 0952) Pulse Rate:  [71-91] 78 (10/15 0952) Resp:   [8-22] 14 (10/15 0952) BP: (119-169)/(60-81) 169/70 mmHg (10/15 0952) SpO2:  [92 %-100 %] 97 % (10/15 0952)  HEMODYNAMICS:   VENTILATOR SETTINGS:   INTAKE / OUTPUT:  Intake/Output Summary (Last 24 hours) at 01/22/14 0956 Last data filed at 01/22/14 0900  Gross per 24 hour  Intake   3768 ml  Output   4880 ml  Net  -1112 ml   PHYSICAL EXAMINATION: General:  wdwn adult male in NAD  Neuro: Awake and interactive, moving all ext to command but with depressed mood. HEENT:  Pink/moist, good dentition, no jvd  Cardiovascular:  s1s2 rrr, no m/r/g Lungs:  resp's even/non-labored, lungs bilaterally clear  Abdomen:  Round/soft, bsx4 active  Musculoskeletal:  No acute deformities  Skin:  Warmd/dry, no edema   LABS:  CBC  Recent Labs Lab 02/27/14 1007  02/28/14 0245 02/28/14 0932 02/28/14 2005 01/22/14 0130  WBC 18.1*  --  17.0*  --   --  10.3  HGB 10.1*  < > 8.3* 8.8* 8.0* 7.7*  7.8*  HCT 28.0*  < > 23.7* 25.1* 22.8* 22.0*  22.1*  PLT 99*  --  99*  --   --  98*  < > = values in this interval not displayed. Coag's  Recent Labs Lab 03/14/2014 0934 02/27/14 1007  APTT 25  --   INR 1.15 1.18   BMET  Recent Labs Lab 02/27/14 0224 02/28/14 0245 01/22/14 0130  NA 142 151* 144  K 4.9 3.5* 3.2*  CL 112 117* 106  CO2 13* 23 29  BUN 39* 35*  15  CREATININE 1.86* 1.25 0.91  GLUCOSE 316* 103* 94   Electrolytes  Recent Labs Lab 02/27/14 0224 02/28/14 0245 03/04/2014 0130  CALCIUM 7.0* 7.3* 7.4*  MG  --   --  2.1  PHOS  --   --  2.0*   Sepsis Markers  Recent Labs Lab 02/27/14 0224  LATICACIDVEN 4.5*   ABG  Recent Labs Lab 03/03/2014 2356 02/27/14 0423  PHART 7.459* 7.431  PCO2ART 14.4* 21.0*  PO2ART 86.7 131.0*   Liver Enzymes  Recent Labs Lab 02/19/2014 0934 02/27/14 0224  AST 59* 39*  ALT 66* 44  ALKPHOS 70 42  BILITOT 0.6 1.0  ALBUMIN 3.0* 2.2*   Cardiac Enzymes No results found for this basename: TROPONINI, PROBNP,  in the last 168  hours Glucose  Recent Labs Lab 02/28/14 1149 02/28/14 1618 02/28/14 2000 02/22/2014 0003 02/15/2014 0438 02/21/2014 0853  GLUCAP 128* 184* 132* 124* 96 105*   Imaging Ct Chest W Contrast  02/28/2014   CLINICAL DATA:  Initial evaluation for bloody stools last night, dizziness, left-sided weakness, confusion, MRI showed acute infarct in the pons, concern for possible malignancy causing gastrointestinal hemorrhage  EXAM: CT CHEST, ABDOMEN, AND PELVIS WITH CONTRAST  TECHNIQUE: Multidetector CT imaging of the chest, abdomen and pelvis was performed following the standard protocol during bolus administration of intravenous contrast.  CONTRAST:  100mL OMNIPAQUE IOHEXOL 300 MG/ML  SOLN  COMPARISON:  None.  FINDINGS: CT CHEST FINDINGS  There is mild bilateral dependent atelectasis. No pleural or pericardial effusion.  Thoracic inlet is normal. Mediastinal contents normal. Heart size normal. Mild calcification mitral valve.  No significant hilar or mediastinal adenopathy. Other than atelectasis, the lungs are clear. There are no acute musculoskeletal abnormalities involving the bony thorax.  CT ABDOMEN AND PELVIS FINDINGS  Liver is normal. Gallbladder is surgically absent and there is mild dilatation of the common bile duct which is likely the result of prior cholecystectomy.  Pancreas is normal.  Spleen is normal.  Stomach is normal.  Adrenal glands are normal. Kidneys are normal. Abdominal aorta shows no dilatation but does demonstrate mild calcification. There is mild calcification at the origin of the right renal artery as well. There is mild calcification of the iliac arteries.  A Foley catheter decompresses the bladder. Air within the lumen of the bladder is likely related to placement of Foley catheter. Reproductive organs show no acute abnormalities.  There is no ascites. There is no significant adenopathy in the abdomen or pelvis. There are no acute musculoskeletal abnormalities in the abdomen and pelvis.   Small bowel is normal. Appendix is normal. There is a mixture of stool and fluid in the rectum. Rectum is distended to about 6.7 cm. Mild narrowing mid to distal sigmoid colon may be due to peristalsis.  IMPRESSION: 1. No acute abnormality in the thorax 2. No acute abnormality in the abdomen or pelvis. 3. Fluid into the rectum consistent with history of diarrhea and hemorrhage. No definitive specific bowel abnormalities. Narrowing sigmoid colon may simply be related to peristalsis. Underlying mass not excluded and colonoscopy or barium enema suggested if there is concern for large bowel malignancy.   Electronically Signed   By: Esperanza Heiraymond  Rubner M.D.   On: 02/28/2014 14:53   Ct Abdomen Pelvis W Contrast  02/28/2014   CLINICAL DATA:  Initial evaluation for bloody stools last night, dizziness, left-sided weakness, confusion, MRI showed acute infarct in the pons, concern for possible malignancy causing gastrointestinal hemorrhage  EXAM: CT CHEST, ABDOMEN, AND  PELVIS WITH CONTRAST  TECHNIQUE: Multidetector CT imaging of the chest, abdomen and pelvis was performed following the standard protocol during bolus administration of intravenous contrast.  CONTRAST:  OMNIPAQUE IOHEXOL 300 MG/ML  SOLN  COMPARISON:  None.  FINDINGS: CT CHEST FINDINGS  There is mild bilateral dependent atelectasis. No pleural or pericardial effusion.  Thoracic inlet is normal. Mediastinal contents normal. Heart size normal. Mild calcification mitral valve.  No significant hilar or mediastinal adenopathy. Other than atelectasis, the lungs are clear. There are no acute musculoskeletal abnormalities involving the bony thorax.  CT ABDOMEN AND PELVIS FINDINGS  Liver is normal. Gallbladder is surgically absent and there is mild dilatation of the common bile duct which is likely the result of prior cholecystectomy.  Pancreas is normal.  Spleen is normal.  Stomach is normal.  Adrenal glands are normal. Kidneys are normal. Abdominal aorta shows no  dilatation but does demonstrate mild calcification. There is mild calcification at the origin of the right renal artery as well. There is mild calcification of the iliac arteries.  A Foley catheter decompresses the bladder. Air within the lumen of the bladder is likely related to placement of Foley catheter. Reproductive organs show no acute abnormalities.  There is no ascites. There is no significant adenopathy in the abdomen or pelvis. There are no acute musculoskeletal abnormalities in the abdomen and pelvis.  Small bowel is normal. Appendix is normal. There is a mixture of stool and fluid in the rectum. Rectum is distended to about 6.7 cm. Mild narrowing mid to distal sigmoid colon may be due to peristalsis.  IMPRESSION: 1. No acute abnormality in the thorax 2. No acute abnormality in the abdomen or pelvis. 3. Fluid into the rectum consistent with history of diarrhea and hemorrhage. No definitive specific bowel abnormalities. Narrowing sigmoid colon may simply be related to peristalsis. Underlying mass not excluded and colonoscopy or barium enema suggested if there is concern for large bowel malignancy.   Electronically Signed   By: Esperanza Heir M.D.   On: 02/28/2014 14:53   Dg Chest Port 1 View  02/28/2014   CLINICAL DATA:  Respiratory distress  EXAM: PORTABLE CHEST - 1 VIEW  COMPARISON:  Portable chest x-ray of 02/27/2014  FINDINGS: No active infiltrate or effusion is seen. Mediastinal hilar contours are unchanged. The heart is within normal limits in size. No bony abnormality is noted.  IMPRESSION: No active disease.   Electronically Signed   By: Dwyane Dee M.D.   On: 02/28/2014 08:00   ASSESSMENT / PLAN:  NEUROLOGIC A:   CVA - with brainstem involvement per CT Acute Encephalopathy - in setting of CVA P:   Neurology following Transfer to tele Neuro checks Further imaging per Neurology   PULMONARY OETT  A: At Risk Respiratory Failure, Aspiration - in the setting of CVA P:   Raise  HOB Aspiration precautions O2 to support sats > 92% Trend CXR   CARDIOVASCULAR CVL A:  Hypertension  Tachycardia - in the setting of volume loss  P:  Assess ECHO, pending Tele monitoring BP control per neuro recommendations  RENAL A:   Acute Kidney Injury Elevated Lactate  Hyperkalemia  Compensated Metabolic Acidosis - likely non-gap component with GI loss P:   Trend BMP  Ensure adequate perfusion, control BP Strict I/O's, foley catheter Sodium bicarbonate gtt for hyperchloremia Encourage PO for hypernatremia  GASTROINTESTINAL A:   Acute Diverticular Bleed Nausea / Vomiting  Hepatitis C P:   Swallow evaluation and diet per speech  recommendations PPI x 1 GI Following, appreciate input  HEMATOLOGIC A:   Anemia  Thrombocytopenia  P:  Trend CBC Tx for Hgb <7%, active bleeding or MI < 8%  INFECTIOUS A:   Leukocytosis  At Risk for Aspiration  P:   Monitor fever curve / leukocytosis  ENDOCRINE A:   Hyperglycemia   P:   SSI   Family updated:  Interdisciplinary Family Meeting v Palliative Care Meeting:   Transfer to tele, transfer back to [facility], PCCM will sign off, please call back as needed..  I have personally obtained a history, examined the patient, evaluated laboratory and imaging results, formulated the assessment and plan and placed orders.  Alyson Reedy, M.D. [facility] Pulmonary/Critical Care Medicine. Pager: [phone]. After hours pager: [phone].  03-29-14, 9:56 AM

## 2014-03-01 NOTE — Progress Notes (Signed)
PT Cancellation Note  Patient Details Name: Arthur HummerRonald B Fatheree MRN: [id number] DOB: January 11, 1951   Cancelled Treatment:    Reason Eval/Treat Not Completed: Patient at procedure or test/unavailable. Will check back.   Deloma Spindle 02/24/2014, 10:06 AM Cathlyn ParsonsElizabeth Kieli Golladay, SPT

## 2014-03-01 NOTE — Consult Note (Signed)
ELECTROPHYSIOLOGY CONSULT NOTE  Patient ID: Arthur Lawrence MRN: [id number], DOB/AGE: January 22, 1951   Admit date: 02/18/2014 Date of Consult: 03-18-14  Primary Physician: Maryelizabeth Rowan, MD Primary Cardiologist: new to [facility] Reason for Consultation: Cryptogenic stroke; recommendations regarding Implantable Loop Recorder  History of Present Illness Arthur Lawrence was admitted on 02/28/2014 with BRBPR and left sided weakness.  Imaging demonstrated acute pontine stroke.  He has undergone workup for stroke including echocardiogram and carotid dopplers.  The patient has been monitored on telemetry which has demonstrated sinus rhythm with no arrhythmias.  GI evaluation demonstrated diverticular bleed with recommendations for current conservative therapy.  Hgb is currently trending down.  Inpatient stroke work-up is to be completed with a TEE.   Echocardiogram this admission demonstrated EF 60-65%, no RWMA, grade 1 diastolic dysfunction, trace TR, LA 34.  Lab work is reviewed.   Prior to admission, the patient denies chest pain, shortness of breath, dizziness, palpitations, or syncope.  They are recovering from their stroke with plans to go to inpatient rehab at discharge.  EP has been asked to evaluate for placement of an implantable loop recorder to monitor for atrial fibrillation.  ROS is negative except as outlined above.    Past Medical History  Diagnosis Date  . Hypertension   . Allergic rhinitis   . Hyperlipidemia due to type 1 diabetes mellitus   . Type II diabetes mellitus   . History of blood transfusion 1976; 03/04/2014    "work accident; LGIB"  . Hepatitis C   . Acute lower GI bleeding 03/10/2014    "stemming from diverticulosis"     Surgical History:  Past Surgical History  Procedure Laterality Date  . Laparoscopic cholecystectomy  2013  . Esophagogastroduodenoscopy N/A 03/13/2014    Procedure: ESOPHAGOGASTRODUODENOSCOPY (EGD);  Surgeon: Florencia Reasons,  MD;  Location: [facility] ENDOSCOPY;  Service: Endoscopy;  Laterality: N/A;  . Colonoscopy N/A 02/17/2014    Procedure: COLONOSCOPY;  Surgeon: Florencia Reasons, MD;  Location: [facility] ENDOSCOPY;  Service: Endoscopy;  Laterality: N/A;     Prescriptions prior to admission  Medication Sig Dispense Refill  . aspirin EC 81 MG tablet Take 81 mg by mouth daily.      . Canagliflozin (INVOKANA) 300 MG TABS Take 300 mg by mouth daily.      Marland Kitchen lisinopril (PRINIVIL,ZESTRIL) 40 MG tablet Take 40 mg by mouth daily.      . metFORMIN (GLUCOPHAGE-XR) 500 MG 24 hr tablet Take 500 mg by mouth daily with breakfast.      . Omega-3 Fatty Acids (FISH OIL PO) Take 1 tablet by mouth daily.      Marland Kitchen OVER THE COUNTER MEDICATION Take 1 tablet by mouth daily. Cut        Inpatient Medications:  .  stroke: mapping our early stages of recovery book   Does not apply Once  . sodium chloride   Intravenous Once  . insulin aspart  2-6 Units Subcutaneous 6 times per day  . sodium chloride  3 mL Intravenous Q12H  . sodium phosphate  Dextrose 5% IVPB  10 mmol Intravenous Once    Allergies: No Known Allergies  History   Social History  . Marital Status: Single    Spouse Name: N/A    Number of Children: N/A  . Years of Education: N/A   Occupational History  . Not on file.   Social History Main Topics  . Smoking status: Former Smoker    Types: Cigarettes  . Smokeless  tobacco: Never Used     Comment: "smoked periodically in his 20's"  . Alcohol Use: No  . Drug Use: No  . Sexual Activity: No   Other Topics Concern  . Not on file   Social History Narrative  . No narrative on file   Family History - patient unable to provide  Physical Exam: Filed Vitals:   2013-11-18 1109 2013-11-18 1136 2013-11-18 1200 2013-11-18 1303  BP: 139/65  134/70 147/69  Pulse: 86  76 78  Temp:  99 F (37.2 C)  98.9 F (37.2 C)  TempSrc:  Oral  Oral  Resp: 15  17 20   Height:      Weight:      SpO2: 100%  99% 100%    GEN- The patient is well  appearing, alert and oriented x 3 today.   Head- normocephalic, atraumatic Eyes-  Sclera clear, conjunctiva pale Ears- hearing intact Oropharynx- clear Neck- supple, Lungs- Clear to ausculation bilaterally, normal work of breathing Heart- Regular rate and rhythm  GI- soft, NT, ND, + BS Extremities- no clubbing, cyanosis, or edema  MS- no significant deformity or atrophy Skin- no rash or lesion Psych- euthymic mood, flat affect   Labs:   Lab Results  Component Value Date   WBC 10.3 03/20/2014   HGB 8.3* 03/20/2014   HCT 23.9* 03/20/2014   MCV 87.4 03/20/2014   PLT 98* 03/20/2014    Recent Labs Lab 02/27/14 0224  2013-11-18 0130  NA 142  < > 144  K 4.9  < > 3.2*  CL 112  < > 106  CO2 13*  < > 29  BUN 39*  < > 15  CREATININE 1.86*  < > 0.91  CALCIUM 7.0*  < > 7.4*  PROT 4.3*  --   --   BILITOT 1.0  --   --   ALKPHOS 42  --   --   ALT 44  --   --   AST 39*  --   --   GLUCOSE 316*  < > 94  < > = values in this interval not displayed. No results found for this basename: CKTOTAL, CKMB, CKMBINDEX, TROPONINI      Radiology/Studies: Ct Angio Head W/cm &/or Wo Cm 02/27/2014   CLINICAL DATA:  Unresponsive, lethargy.  EXAM: CT ANGIOGRAPHY HEAD  TECHNIQUE: Multidetector CT imaging of the head was performed using the standard protocol during bolus administration of intravenous contrast. Multiplanar CT image reconstructions and MIPs were obtained to evaluate the vascular anatomy.  CONTRAST:  50 cc Omnipaque 300  COMPARISON:  CT angiogram of the head February 26, 2014  FINDINGS: CT head:  The ventricles and sulci are normal for age. No intraparenchymal hemorrhage, mass effect nor midline shift. Confluent supratentorial white matter hypodensities are wit similar. Patchy pontine hypodensity. No acute large vascular territory infarcts.  No abnormal extra-axial fluid collections. Basal cisterns are patent. Moderate calcific atherosclerosis of the carotid siphons.  No skull fracture. The  included ocular globes and orbital contents are non-suspicious. The mastoid aircells and included paranasal sinuses are well-aerated.  No abnormal parenchymal nor extra-axial enhancement.  CTA head:  Anterior circulation: Normal appearance of the cervical internal carotid arteries, petrous, cavernous and supra clinoid internal carotid arteries. Mild calcific atherosclerosis of the carotid siphons. Widely patent anterior communicating artery. Normal appearance of the anterior and middle cerebral arteries. Early LEFT middle cerebral artery bifurcation, minimal irregularity of the M2 segments.  Posterior circulation: Codominant vertebral arteries with normal appearance of  the vertebral arteries, vertebrobasilar junction and basilar artery, as well as main branch vessels. Mild calcific atherosclerosis. Patent of the posterior cerebral arteries, minimal luminal irregularity.  No large vessel occlusion, hemodynamically significant stenosis, dissection, contrast extravasation or aneurysm within the anterior nor posterior circulation.  Review of the MIP images confirms the above findings.  IMPRESSION: CT head: Stable appearance of the head: Severe white matter changes suggest chronic small vessel ischemic disease. Stable pontine infarct.  CTA head: No acute vascular process or hemodynamically significant stenosis.  Mild irregularity of the intracranial vessels most consistent with atherosclerosis.   Electronically Signed   By: Awilda Metro   On: 02/27/2014 02:43   Ct Chest W Contrast 02/28/2014   CLINICAL DATA:  Initial evaluation for bloody stools last night, dizziness, left-sided weakness, confusion, MRI showed acute infarct in the pons, concern for possible malignancy causing gastrointestinal hemorrhage  EXAM: CT CHEST, ABDOMEN, AND PELVIS WITH CONTRAST  TECHNIQUE: Multidetector CT imaging of the chest, abdomen and pelvis was performed following the standard protocol during bolus administration of intravenous  contrast.  CONTRAST:  OMNIPAQUE IOHEXOL 300 MG/ML  SOLN  COMPARISON:  None.  FINDINGS: CT CHEST FINDINGS  There is mild bilateral dependent atelectasis. No pleural or pericardial effusion.  Thoracic inlet is normal. Mediastinal contents normal. Heart size normal. Mild calcification mitral valve.  No significant hilar or mediastinal adenopathy. Other than atelectasis, the lungs are clear. There are no acute musculoskeletal abnormalities involving the bony thorax.  CT ABDOMEN AND PELVIS FINDINGS  Liver is normal. Gallbladder is surgically absent and there is mild dilatation of the common bile duct which is likely the result of prior cholecystectomy.  Pancreas is normal.  Spleen is normal.  Stomach is normal.  Adrenal glands are normal. Kidneys are normal. Abdominal aorta shows no dilatation but does demonstrate mild calcification. There is mild calcification at the origin of the right renal artery as well. There is mild calcification of the iliac arteries.  A Foley catheter decompresses the bladder. Air within the lumen of the bladder is likely related to placement of Foley catheter. Reproductive organs show no acute abnormalities.  There is no ascites. There is no significant adenopathy in the abdomen or pelvis. There are no acute musculoskeletal abnormalities in the abdomen and pelvis.  Small bowel is normal. Appendix is normal. There is a mixture of stool and fluid in the rectum. Rectum is distended to about 6.7 cm. Mild narrowing mid to distal sigmoid colon may be due to peristalsis.  IMPRESSION: 1. No acute abnormality in the thorax 2. No acute abnormality in the abdomen or pelvis. 3. Fluid into the rectum consistent with history of diarrhea and hemorrhage. No definitive specific bowel abnormalities. Narrowing sigmoid colon may simply be related to peristalsis. Underlying mass not excluded and colonoscopy or barium enema suggested if there is concern for large bowel malignancy.   Electronically Signed   By:  Esperanza Heir M.D.   On: 02/28/2014 14:53   Dg Chest Port 1 View 02/28/2014   CLINICAL DATA:  Respiratory distress  EXAM: PORTABLE CHEST - 1 VIEW  COMPARISON:  Portable chest x-ray of 02/27/2014  FINDINGS: No active infiltrate or effusion is seen. Mediastinal hilar contours are unchanged. The heart is within normal limits in size. No bony abnormality is noted.  IMPRESSION: No active disease.   Electronically Signed     12-lead ECG sinus tach, rate 104, normal intervals, LVH  Telemetry sinus rhythm with no atrial arrhythmias  Assessment and Plan:  1. Cryptogenic stroke The patient presents with cryptogenic stroke.  The patient had a TEE this am which was unremarkable.  I spoke at length with the patient and his spouse about monitoring for afib with either a 30 day event monitor or an implantable loop recorder.  Given that the patient cannot be anticoagulated at this time should Afib be found, I am not sure that he would benefit from implantable loop recorder placement.  The patient and his spouse are clear that they would like to avoid this procedure at his time. I would recommend follow-up with Tereso NewcomerScott Weaver in 4 weeks for discussion of 30 day monitor vs implantable loop recorder at that time.  Discussed with Dr Warrick ParisianBucinni who feels that he would be a candidate for anticoagulation in the future.  Would keep on telemetry while here.  Electrophysiology team to see as needed while here. Please call with questions.

## 2014-03-01 NOTE — Progress Notes (Signed)
OT Cancellation Note  Patient Details Name: Arthur Lawrence MRN: [id number] DOB: 12-04-1950   Cancelled Treatment:    Reason Eval/Treat Not Completed: Other (comment) PT just back from TEE. Will attempt to see this pm. Sycamore SpringsWARD,HILLARY Zavier Canela, OTR/L  [phone] 03/12/2014 02/26/2014, 11:25 AM

## 2014-03-01 NOTE — Progress Notes (Signed)
[facility] FOR [facility] LAB REPLACEMENT ONLY  The patient does apply [facility] the [facility]ly[facility] Protocol based on the criteria listed below:   1. Is GFR >/= 40 ml/min? Yes.    Patient's GFR today is >90 2. Is urine output >/= 0.5 ml/kg/hr for the last 6 hours? Yes.   Patient's UOP is 0.5 ml/kg/hr 3. Is BUN < 60 mg/dL? Yes.    Patient's BUN today is 15 4. Abnormal electrolyte(s): K+3.2  Phos 2.0 5. Ordered repletion with: protocol 6. If a panic level lab has been reported, has the CCM MD in charge been notified? No..   Physician:  Eulogio BearSimonds  Arthur Lawrence August 30, 2013 3:44 AM

## 2014-03-01 NOTE — Plan of Care (Addendum)
Problem: Phase II Progression Outcomes Goal: No active bleeding No external, rectal bleeding noted during shift though MD made aware of hemoglobin trending down. Will continue to monitor.

## 2014-03-01 NOTE — CV Procedure (Signed)
See full TEE report in [employer]; normal LV function; negative saline microcavitation study. Ryden Wainer  

## 2014-03-01 NOTE — Progress Notes (Signed)
Chaplain initiated follow up with family. Chaplain introduced herself to pt family friend. Will follow as needed.   03/10/2014 1100  Clinical Encounter Type  Visited With Patient and family together  Visit Type Follow-up  Nadalie Laughner, Mayer MaskerCourtney F, Chaplain 03/12/2014 11:54 AM

## 2014-03-01 NOTE — Progress Notes (Signed)
STROKE TEAM PROGRESS NOTE   HISTORY Arthur Lawrence is an 63 y.o. male who reports that he awakened early this morning and went to the bathroom. He noted some blood in his stools but was able to go back to sleep. When he awakened again about 30 minutes later he had more blood in hid stools but noted on getting up that he was dizzy and had some left sided weakness. He had two more bouts of bloody stools. Family also noted some confusion and EMS was called. Code stroke was called in the ED.  Date last known well: Date: March 17, 2014  Time last known well: Time: 05:30  tPA Given: No: Active GI bleed, resolution of symptoms  SUBJECTIVE (INTERVAL HISTORY) RN is at bedside. Pt did not sleep well last night and this morning more sleepy. Did TEE today and no cardio embolic source found. Loop was on hold due to not a good anticoagulation candidate at this time. Will follow up in clinic to decide once his LGIB resolved. His Hb stable and no bloody stool overnight.   OBJECTIVE Temp:  [98.1 F (36.7 C)-99.7 F (37.6 C)] 98.9 F (37.2 C) (10/15 1303) Pulse Rate:  [74-99] 78 (10/15 1303) Cardiac Rhythm:  [-] Normal sinus rhythm (10/15 0800) Resp:  [8-29] 20 (10/15 1303) BP: (133-185)/(60-79) 147/69 mmHg (10/15 1303) SpO2:  [97 %-100 %] 100 % (10/15 1303)   Recent Labs Lab 03/02/2014 0438 02/15/2014 0853 03/11/2014 1018 03/10/2014 1134 02/20/2014 1602  GLUCAP 96 105* 104* 108* 107*    Recent Labs Lab March 17, 2014 0934 03/17/14 0953 02/27/14 0224 02/28/14 0245 02/19/2014 0130  NA 141 141 142 151* 144  K 4.8 4.7 4.9 3.5* 3.2*  CL 106 109 112 117* 106  CO2 19  --  13* 23 29  GLUCOSE 323* 323* 316* 103* 94  BUN 31* 29* 39* 35* 15  CREATININE 1.28 1.30 1.86* 1.25 0.91  CALCIUM 8.7  --  7.0* 7.3* 7.4*  MG  --   --   --   --  2.1  PHOS  --   --   --   --  2.0*    Recent Labs Lab 2014-03-17 0934 02/27/14 0224  AST 59* 39*  ALT 66* 44  ALKPHOS 70 42  BILITOT 0.6 1.0  PROT 6.0 4.3*  ALBUMIN 3.0* 2.2*     Recent Labs Lab 17-Mar-2014 0934  02/27/14 0224 02/27/14 1007  02/28/14 0245 02/28/14 0932 02/28/14 2005 03/08/2014 0130 03/08/2014 0850  WBC 13.2*  --  15.3* 18.1*  --  17.0*  --   --  10.3  --   NEUTROABS 10.2*  --   --   --   --   --   --   --   --   --   HGB 10.5*  < > 9.3*  9.3* 10.1*  < > 8.3* 8.8* 8.0* 7.7*  7.8* 8.3*  HCT 30.6*  < > 26.5*  25.8* 28.0*  < > 23.7* 25.1* 22.8* 22.0*  22.1* 23.9*  MCV 89.5  --  88.1 85.1  --  85.6  --   --  87.4  --   PLT 236  --  125* 99*  --  99*  --   --  98*  --   < > = values in this interval not displayed. No results found for this basename: CKTOTAL, CKMB, CKMBINDEX, TROPONINI,  in the last 168 hours  Recent Labs  02/27/14 1007  LABPROT 15.0  INR 1.18  No results found for this basename: COLORURINE, APPERANCEUR, LABSPEC, PHURINE, GLUCOSEU, HGBUR, BILIRUBINUR, KETONESUR, PROTEINUR, UROBILINOGEN, NITRITE, LEUKOCYTESUR,  in the last 72 hours     Component Value Date/Time   CHOL 80 02/28/2014 0245   Lab Results  Component Value Date   HGBA1C 5.7* 02/28/2014      Component Value Date/Time   LABOPIA NONE DETECTED 03/04/2014 1323   COCAINSCRNUR NONE DETECTED 02/17/2014 1323   LABBENZ NONE DETECTED 02/24/2014 1323   AMPHETMU NONE DETECTED 03/02/2014 1323   THCU NONE DETECTED 03/11/2014 1323   LABBARB NONE DETECTED 02/15/2014 1323     Recent Labs Lab 02/27/2014 0934  ETH <11    Ct Angio Head W/cm &/or Wo Cm 02/27/2014   IMPRESSION: CT head: Stable appearance of the head: Severe white matter changes suggest chronic small vessel ischemic disease. Stable pontine infarct.  CTA head: No acute vascular process or hemodynamically significant stenosis.  Mild irregularity of the intracranial vessels most consistent with atherosclerosis.     Ct Angio Head and neck W/cm &/or Wo Cm 03/15/2014   IMPRESSION: 1. Age indeterminate infarcts involving the brainstem. 2. No significant supratentorial infarct. 3. Moderate to high-grade stenosis of  the proximal right vertebral artery. The vertebral arteries are codominant distally. 4. Minimal atherosclerotic changes at the carotid bifurcations bilaterally without significant stenosis. 5. Atherosclerotic changes within the cavernous carotid arteries bilaterally without significant stenosis. 6. Mild distal small vessel disease.    Ct Head Wo Contrast 03/15/2014   IMPRESSION: Age advanced chronic microvascular changes throughout the deep white matter.  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     Mri and Mra Brain Wo Contrast 02/27/2014   IMPRESSION: MRI HEAD:  Exam is motion degraded.  Numerous punctate and patchy supratentorial infarcts bilaterally. Acute small nonhemorrhagic infarct inferior left pons. Findings raise possibility of embolic disease.  Remote partially hemorrhagic left thalamic and right pontine infarct. Remote left pontine infarct.  Moderate small vessel disease type changes.  MRA HEAD:  Secondary to motion degradation, it is difficult to evaluate for aneurysm or adequately grade stenosis.  Flow is noted within medium and large size intracranial vessels.  Limited evaluation of branch vessels particularly right posterior inferior cerebellar artery.    Dg Chest Port 1 View 02/27/2014  IMPRESSION: No findings to suggest aspiration or pneumonia.   Lawrence  2D echo - - Normal LV function; grade 1 diastolic dysfunction; trace TR. EF 60-65%  EEG - EEG Abnormalities: 1) Mild generalized irregular delta activity.  Clinical Interpretation: This normal EEG is consistent with a  mild generalized non-specific cerebral  dysfunction(encephalopathy). There was no seizure or seizure  predisposition recorded on this study.   Venous doppler -  Bilateral upper extremity venous duplex= No evidence of DVT bilaterally. Superficial thrombosis is noted in the right basilic vein at the [facility] (IV site).  Bilateral lower extremity venous duplex= No evidence of DVT bilaterally.  Pan CT - 1. No acute abnormality in the  thorax  2. No acute abnormality in the abdomen or pelvis.  3. Fluid into the rectum consistent with history of diarrhea and  hemorrhage. No definitive specific bowel abnormalities. Narrowing  sigmoid colon may simply be related to peristalsis. Underlying mass  not excluded and colonoscopy or barium enema suggested if there is  concern for large bowel malignancy.   TEE - normal LV function; negative saline microcavitation study.  Hypercoagulable work up - pending  LDL 22 and A1C 5.7  PHYSICAL EXAM  Temp:  [98.1 F (36.7 C)-99.7 F (37.6  C)] 98.9 F (37.2 C) (10/15 1303) Pulse Rate:  [74-99] 78 (10/15 1303) Resp:  [8-29] 20 (10/15 1303) BP: (133-185)/(60-79) 147/69 mmHg (10/15 1303) SpO2:  [97 %-100 %] 100 % (10/15 1303)  General - Well nourished, well developed, in no acute distress.  Ophthalmologic - not cooperative on exam.  Cardiovascular - Regular rhythm with no murmur.  Mental Status -  Awake alert and orientated x 3, does not know why he is in the hospital.  Flat affect, paucity of language, and moderate psychomotor slowing. Able to follow simple commands, can name objects 2/3 but not able to name parts of objects. Repetition preserved.   Cranial Nerves II - XII - II - blink bilaterally to visual threat. III, IV, VI - Extraocular movements intact. V - Facial sensation intact bilaterally. VII - right nasolabial fold flattening. VIII - Hearing & vestibular intact bilaterally. X - Palate elevates symmetrically. XI - Chin turning & shoulder shrug intact bilaterally. XII - Tongue protrusion intact.  Motor Strength - The patient's strength was 4/5 BUEs and pronator drift was absent. spontaneously moving BLEs but LLE increased muscle tone with gegenhalten type paratonia. Bulk was normal and fasciculations were absent.    Reflexes - The patient's reflexes were 1+ in all extremities and he had positive right babinski but mute left babinski.  Sensory - Light touch,  temperature/pinprick were assessed and were symmetrical.    Coordination - The patient had normal movements in the hands with no ataxia or dysmetria.  Tremor was absent.  Gait and Station - not tested  ASSESSMENT/PLAN Arthur Lawrence is a 63 y.o. male with history of HTN, DM presenting with lower GI bleeding. His last colonoscopy was 2004 when ployp removed and biopsy showed benign. He developed slurry speech and left hemiparesis but improved in ER. He did not receive IV t-PA due to GI bleeding and improving symptoms. 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 Less likely due to hypotension as the stroke distribution is not at watershed pattern.  MRI showed small vessel events including acute and chronic pontine hemorrhage and infarct as well as cardioembolic pattern with anterior circulation embolic shower  CTA head and neck - no significant large vessel stenosis  Venous doppler no DVT seen  TEE no cardiac source of emboli   Pan CT no evidence of malignancy but not able to rule out colon cancer.  2D Echo  unremarkable  LDL 22, within the goal  HgbA1c 5.7, within the goal  SCDs for VTE prophylaxis  Resultant psychomotor slowing and paucity of speech  aspirin 81 mg orally every day prior to admission, now on no antithrombotics  Risk factor education  Ongoing aggressive risk factor management  Once Hb stable will consider resume ASA  At this time, pt is not a good candidate for anticoagulation. Will do loop recorder as outpt once his LGIB was taken care of.   Hypertension  Home meds: lisinopril  Stable at 140-160  now  LGIB - GI on board - currently bleeding stopped - s/p 4 x PRBC transfusion - H&H still down trending - concerning for malignancy.  - CEA normal range  Other Stroke Risk Factors  Advanced age Former cigarette smoker, quit 40 years   Family hx stroke (mom)  Other Active Problems  High Cre - likely contrast related - on bicarb and down trending   Hospital day # 3  Marvel PlanJindong Urias Sheek, MD PhD Stroke Neurology Sep 20, 2013 5:28 PM    To contact Stroke Continuity provider, please refer to [email or web address]. After hours, contact General Neurology

## 2014-03-01 NOTE — Progress Notes (Signed)
Physical Therapy Treatment Patient Details Name: Westley HummerRonald B Tech MRN: [id number] DOB: 18-Mar-1951 Today's Date: 2013-09-20    History of Present Illness 63 y/o AAM with PMH of HTN & DM who presented as a code stroke on 10/12 and had BRBPR. CT head negative. Pt underwent EGD & Colonoscopy on the evening of 10/12. Post procedure, the patient later developed vomiting and decreased mental status. 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 Remote partially hemorrhagic L thalamic and R pontine infarct)     PT Comments    Therapy session performed in conjunction with OT session. Patient tolerated ambulation with assist. Performed some self care and hygiene with assist and cues. Tolerated EOB trunk activities this session. Will continue to see and progress as tolerated. Improvements noted in functional mobility and activity tolerance this session.   Follow Up Recommendations  CIR     Equipment Recommendations  Other (comment) (TBD)    Recommendations for Other Services Rehab consult     Precautions / Restrictions Precautions Precautions: Fall    Mobility  Bed Mobility Overal bed mobility: Needs Assistance Bed Mobility: Supine to Sit     Supine to sit: Min assist     General bed mobility comments: VCs to sit at EOB, increased time to perform task, easily distracted  Transfers Overall transfer level: Needs assistance Equipment used: 2 person hand held assist Transfers: Sit to/from [employer] CorporationStand;Stand Pivot Transfers Sit to Stand: +2 physical assistance;Mod assist Stand pivot transfers: Mod assist;+2 physical assistance       General transfer comment: Used B hand held assist to mobilize pt and given cue "let's walk". narrow scissoring gait pattern at times  Ambulation/Gait Ambulation/Gait assistance: Mod assist;+2 physical assistance Ambulation Distance (Feet): 16 Feet Assistive device: 2 person hand held  assist Gait Pattern/deviations: Step-through pattern;Decreased stride length;Shuffle;Scissoring;Ataxic;Narrow base of support Gait velocity: decreased Gait velocity interpretation: <1.8 ft/sec, indicative of risk for recurrent falls General Gait Details: Used B hand held assist to mobilize pt and given cue "let's walk". narrow scissoring gait pattern at times   Stairs            Wheelchair Mobility    Modified Rankin (Stroke Patients Only)       Balance Overall balance assessment: Needs assistance Sitting-balance support: Feet supported Sitting balance-Leahy Scale: Fair Sitting balance - Comments: posterior and L lat lean when distracted Postural control: Posterior lean Standing balance support: During functional activity Standing balance-Leahy Scale: Poor                      Cognition Arousal/Alertness: Awake/alert Behavior During Therapy: Flat affect Overall Cognitive Status: Impaired/Different from baseline Area of Impairment: Orientation;Attention;Memory;Awareness;Following commands;Safety/judgement;Problem solving Orientation Level: Disoriented to;Place;Time;Situation Current Attention Level: Sustained Memory: Decreased recall of precautions;Decreased short-term memory Following Commands: Follows one step commands with increased time Safety/Judgement: Decreased awareness of safety;Decreased awareness of deficits Awareness: Intellectual Problem Solving: Slow processing;Difficulty sequencing;Requires verbal cues General Comments: easily distracted during tasks, required cues for attention and completion of tasks    Exercises      General Comments General comments (skin integrity, edema, etc.): discussed pt's progress with pt's niece      Pertinent Vitals/Pain Pain Assessment: No/denies pain    Home Living Family/patient expects to be discharged to:: Inpatient rehab                    Prior Function Level of Independence: Independent  Comments: Has 3 PhD's   PT Goals (current goals can now be found in the care plan section) Acute Rehab PT Goals Patient Stated Goal: per wife to get back to independent PT Goal Formulation: With patient/family Time For Goal Achievement: 03/14/14 Potential to Achieve Goals: Good Progress towards PT goals: Progressing toward goals    Frequency  Min 3X/week    PT Plan Current plan remains appropriate    Co-evaluation             End of Session Equipment Utilized During Treatment: Gait belt Activity Tolerance: Patient tolerated treatment well Patient left: in chair;with call bell/phone within reach;with family/visitor present     Time: [id number] PT Time Calculation (min): 39 min  Charges:  $Gait Training: 8-22 mins $Therapeutic Activity: 8-22 mins                    G CodesFabio Asa:      Lindsea Olivar J 03/10/2014, 5:34 PM Charlotte Crumbevon Clester Chlebowski, PT DPT  [phone]

## 2014-03-01 NOTE — Progress Notes (Signed)
SLP Cancellation Note  Patient Details Name: Arthur Lawrence MRN: [id number] DOB: 02/20/1951   Cancelled treatment:       Reason Eval/Treat Not Completed: Patient at procedure or test/unavailable   Bryah Ocheltree, Riley NearingBonnie Caroline 2014-03-23, 10:08 AM

## 2014-03-01 NOTE — Interval H&P Note (Signed)
History and Physical Interval Note:  03/08/2014 10:19 AM  Arthur Lawrence  has presented today for surgery, with the diagnosis of stroke  The various methods of treatment have been discussed with the patient and family. After consideration of risks, benefits and other options for treatment, the patient has consented to  Procedure(s): TRANSESOPHAGEAL ECHOCARDIOGRAM (TEE) (N/A) as a surgical intervention .  The patient's history has been reviewed, patient examined, no change in status, stable for surgery.  I have reviewed the patient's chart and labs.  Questions were answered to the patient's satisfaction.     Olga MillersBrian Tecumseh Yeagley

## 2014-03-01 NOTE — Care Management Note (Unsigned)
    Page 1 of 1   03/06/2014     3:23:11 PM CARE MANAGEMENT NOTE 03/12/2014  Patient:  Westley HummerSHOAF,Rita B   Account Number:  [id number]  Date Initiated:  02/26/2014  Documentation initiated by:  Letha CapeAYLOR,Danny Zimny  Subjective/Objective Assessment:   dx gib , cva  admiit- lives with spouse.     Action/Plan:   pt eval- rec cir   Anticipated DC Date:  03/02/2014   Anticipated DC Plan:  IP REHAB FACILITY  In-house referral  Clinical Social Worker      DC Planning Services  CM consult      Choice offered to / List presented to:             Status of service:  In process, will continue to follow Medicare Important Message given?  NO (If response is "NO", the following Medicare IM given date fields will be blank) Date Medicare IM given:   Medicare IM given by:   Date Additional Medicare IM given:   Additional Medicare IM given by:    Discharge Disposition:    Per UR Regulation:  Reviewed for med. necessity/level of care/duration of stay  If discussed at Long Length of Stay Meetings, dates discussed:    Comments:  02/23/2014 1522 Letha Capeeborah Venetta Knee RN, BSN [phone] patient transferred to tele floor on 10/15, NCM will continue to follow for dc needs.

## 2014-03-01 NOTE — H&P (View-Only) (Signed)
PULMONARY / CRITICAL CARE MEDICINE   Name: Arthur HummerRonald B Earll MRN: [id number] DOB: 12/13/1950    ADMISSION DATE:  02/28/2014 CONSULTATION DATE:  02/27/14  REFERRING MD :  Dr. Benjamine MolaVann   CHIEF COMPLAINT:  AMS, Vomiting   INITIAL PRESENTATION: 63 y/o AAM with PMH of HTN & DM who presented as a code stroke on 10/12 and had BRBPR.  CT head negative.  Pt underwent EGD & Colonoscopy on the evening of 10/12.  Post procedure, the patient later developed vomiting and decreased mental status.  10/13 MRI completed with concerns for watershed stroke.    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 10/13  MRI / MRA Head >> numerous punctate & patchy supratentorial infarcts bilaterally, small infarct inferior L pons 10/13  EEG >> normal EEG is consistent with a mild generalized non-specific cerebral dysfunction (encephalopathy). There was no seizure recorded on this study  SIGNIFICANT EVENTS: 10/12  Admit with near syncope, BRBPR, weakness on L.  Neg CT Head.  EGD & Colonoscopy performed.   10/13 Pontine infarct, transfer to ICU due to lethargy.  SUBJECTIVE: No events overnight, cough in tact.  VITAL SIGNS: Temp:  [98.1 F (36.7 C)-99.7 F (37.6 C)] 98.9 F (37.2 C) (10/15 0952) Pulse Rate:  [71-91] 78 (10/15 0952) Resp:   [8-22] 14 (10/15 0952) BP: (119-169)/(60-81) 169/70 mmHg (10/15 0952) SpO2:  [92 %-100 %] 97 % (10/15 0952)  HEMODYNAMICS:   VENTILATOR SETTINGS:   INTAKE / OUTPUT:  Intake/Output Summary (Last 24 hours) at 01/22/14 0956 Last data filed at 01/22/14 0900  Gross per 24 hour  Intake   3768 ml  Output   4880 ml  Net  -1112 ml   PHYSICAL EXAMINATION: General:  wdwn adult male in NAD  Neuro: Awake and interactive, moving all ext to command but with depressed mood. HEENT:  Pink/moist, good dentition, no jvd  Cardiovascular:  s1s2 rrr, no m/r/g Lungs:  resp's even/non-labored, lungs bilaterally clear  Abdomen:  Round/soft, bsx4 active  Musculoskeletal:  No acute deformities  Skin:  Warmd/dry, no edema   LABS:  CBC  Recent Labs Lab 02/27/14 1007  02/28/14 0245 02/28/14 0932 02/28/14 2005 01/22/14 0130  WBC 18.1*  --  17.0*  --   --  10.3  HGB 10.1*  < > 8.3* 8.8* 8.0* 7.7*  7.8*  HCT 28.0*  < > 23.7* 25.1* 22.8* 22.0*  22.1*  PLT 99*  --  99*  --   --  98*  < > = values in this interval not displayed. Coag's  Recent Labs Lab 03/14/2014 0934 02/27/14 1007  APTT 25  --   INR 1.15 1.18   BMET  Recent Labs Lab 02/27/14 0224 02/28/14 0245 01/22/14 0130  NA 142 151* 144  K 4.9 3.5* 3.2*  CL 112 117* 106  CO2 13* 23 29  BUN 39* 35*  15  CREATININE 1.86* 1.25 0.91  GLUCOSE 316* 103* 94   Electrolytes  Recent Labs Lab 02/27/14 0224 02/28/14 0245 03/04/2014 0130  CALCIUM 7.0* 7.3* 7.4*  MG  --   --  2.1  PHOS  --   --  2.0*   Sepsis Markers  Recent Labs Lab 02/27/14 0224  LATICACIDVEN 4.5*   ABG  Recent Labs Lab 03/03/2014 2356 02/27/14 0423  PHART 7.459* 7.431  PCO2ART 14.4* 21.0*  PO2ART 86.7 131.0*   Liver Enzymes  Recent Labs Lab 02/19/2014 0934 02/27/14 0224  AST 59* 39*  ALT 66* 44  ALKPHOS 70 42  BILITOT 0.6 1.0  ALBUMIN 3.0* 2.2*   Cardiac Enzymes No results found for this basename: TROPONINI, PROBNP,  in the last 168  hours Glucose  Recent Labs Lab 02/28/14 1149 02/28/14 1618 02/28/14 2000 02/22/2014 0003 02/15/2014 0438 03/16/2014 0853  GLUCAP 128* 184* 132* 124* 96 105*   Imaging Ct Chest W Contrast  02/28/2014   CLINICAL DATA:  Initial evaluation for bloody stools last night, dizziness, left-sided weakness, confusion, MRI showed acute infarct in the pons, concern for possible malignancy causing gastrointestinal hemorrhage  EXAM: CT CHEST, ABDOMEN, AND PELVIS WITH CONTRAST  TECHNIQUE: Multidetector CT imaging of the chest, abdomen and pelvis was performed following the standard protocol during bolus administration of intravenous contrast.  CONTRAST:  100mL OMNIPAQUE IOHEXOL 300 MG/ML  SOLN  COMPARISON:  None.  FINDINGS: CT CHEST FINDINGS  There is mild bilateral dependent atelectasis. No pleural or pericardial effusion.  Thoracic inlet is normal. Mediastinal contents normal. Heart size normal. Mild calcification mitral valve.  No significant hilar or mediastinal adenopathy. Other than atelectasis, the lungs are clear. There are no acute musculoskeletal abnormalities involving the bony thorax.  CT ABDOMEN AND PELVIS FINDINGS  Liver is normal. Gallbladder is surgically absent and there is mild dilatation of the common bile duct which is likely the result of prior cholecystectomy.  Pancreas is normal.  Spleen is normal.  Stomach is normal.  Adrenal glands are normal. Kidneys are normal. Abdominal aorta shows no dilatation but does demonstrate mild calcification. There is mild calcification at the origin of the right renal artery as well. There is mild calcification of the iliac arteries.  A Foley catheter decompresses the bladder. Air within the lumen of the bladder is likely related to placement of Foley catheter. Reproductive organs show no acute abnormalities.  There is no ascites. There is no significant adenopathy in the abdomen or pelvis. There are no acute musculoskeletal abnormalities in the abdomen and pelvis.   Small bowel is normal. Appendix is normal. There is a mixture of stool and fluid in the rectum. Rectum is distended to about 6.7 cm. Mild narrowing mid to distal sigmoid colon may be due to peristalsis.  IMPRESSION: 1. No acute abnormality in the thorax 2. No acute abnormality in the abdomen or pelvis. 3. Fluid into the rectum consistent with history of diarrhea and hemorrhage. No definitive specific bowel abnormalities. Narrowing sigmoid colon may simply be related to peristalsis. Underlying mass not excluded and colonoscopy or barium enema suggested if there is concern for large bowel malignancy.   Electronically Signed   By: Esperanza Heiraymond  Rubner M.D.   On: 02/28/2014 14:53   Ct Abdomen Pelvis W Contrast  02/28/2014   CLINICAL DATA:  Initial evaluation for bloody stools last night, dizziness, left-sided weakness, confusion, MRI showed acute infarct in the pons, concern for possible malignancy causing gastrointestinal hemorrhage  EXAM: CT CHEST, ABDOMEN, AND  PELVIS WITH CONTRAST  TECHNIQUE: Multidetector CT imaging of the chest, abdomen and pelvis was performed following the standard protocol during bolus administration of intravenous contrast.  CONTRAST:  OMNIPAQUE IOHEXOL 300 MG/ML  SOLN  COMPARISON:  None.  FINDINGS: CT CHEST FINDINGS  There is mild bilateral dependent atelectasis. No pleural or pericardial effusion.  Thoracic inlet is normal. Mediastinal contents normal. Heart size normal. Mild calcification mitral valve.  No significant hilar or mediastinal adenopathy. Other than atelectasis, the lungs are clear. There are no acute musculoskeletal abnormalities involving the bony thorax.  CT ABDOMEN AND PELVIS FINDINGS  Liver is normal. Gallbladder is surgically absent and there is mild dilatation of the common bile duct which is likely the result of prior cholecystectomy.  Pancreas is normal.  Spleen is normal.  Stomach is normal.  Adrenal glands are normal. Kidneys are normal. Abdominal aorta shows no  dilatation but does demonstrate mild calcification. There is mild calcification at the origin of the right renal artery as well. There is mild calcification of the iliac arteries.  A Foley catheter decompresses the bladder. Air within the lumen of the bladder is likely related to placement of Foley catheter. Reproductive organs show no acute abnormalities.  There is no ascites. There is no significant adenopathy in the abdomen or pelvis. There are no acute musculoskeletal abnormalities in the abdomen and pelvis.  Small bowel is normal. Appendix is normal. There is a mixture of stool and fluid in the rectum. Rectum is distended to about 6.7 cm. Mild narrowing mid to distal sigmoid colon may be due to peristalsis.  IMPRESSION: 1. No acute abnormality in the thorax 2. No acute abnormality in the abdomen or pelvis. 3. Fluid into the rectum consistent with history of diarrhea and hemorrhage. No definitive specific bowel abnormalities. Narrowing sigmoid colon may simply be related to peristalsis. Underlying mass not excluded and colonoscopy or barium enema suggested if there is concern for large bowel malignancy.   Electronically Signed   By: Esperanza Heir M.D.   On: 02/28/2014 14:53   Dg Chest Port 1 View  02/28/2014   CLINICAL DATA:  Respiratory distress  EXAM: PORTABLE CHEST - 1 VIEW  COMPARISON:  Portable chest x-ray of 02/27/2014  FINDINGS: No active infiltrate or effusion is seen. Mediastinal hilar contours are unchanged. The heart is within normal limits in size. No bony abnormality is noted.  IMPRESSION: No active disease.   Electronically Signed   By: Dwyane Dee M.D.   On: 02/28/2014 08:00   ASSESSMENT / PLAN:  NEUROLOGIC A:   CVA - with brainstem involvement per CT Acute Encephalopathy - in setting of CVA P:   Neurology following Transfer to tele Neuro checks Further imaging per Neurology   PULMONARY OETT  A: At Risk Respiratory Failure, Aspiration - in the setting of CVA P:   Raise  HOB Aspiration precautions O2 to support sats > 92% Trend CXR   CARDIOVASCULAR CVL A:  Hypertension  Tachycardia - in the setting of volume loss  P:  Assess ECHO, pending Tele monitoring BP control per neuro recommendations  RENAL A:   Acute Kidney Injury Elevated Lactate  Hyperkalemia  Compensated Metabolic Acidosis - likely non-gap component with GI loss P:   Trend BMP  Ensure adequate perfusion, control BP Strict I/O's, foley catheter Sodium bicarbonate gtt for hyperchloremia Encourage PO for hypernatremia  GASTROINTESTINAL A:   Acute Diverticular Bleed Nausea / Vomiting  Hepatitis C P:   Swallow evaluation and diet per speech  recommendations PPI x 1 GI Following, appreciate input  HEMATOLOGIC A:   Anemia  Thrombocytopenia  P:  Trend CBC Tx for Hgb <7%, active bleeding or MI < 8%  INFECTIOUS A:   Leukocytosis  At Risk for Aspiration  P:   Monitor fever curve / leukocytosis  ENDOCRINE A:   Hyperglycemia   P:   SSI   Family updated:  Interdisciplinary Family Meeting v Palliative Care Meeting:   Transfer to tele, transfer back to [facility], PCCM will sign off, please call back as needed..  I have personally obtained a history, examined the patient, evaluated laboratory and imaging results, formulated the assessment and plan and placed orders.  Alyson Reedy, M.D. [facility] Pulmonary/Critical Care Medicine. Pager: [phone]. After hours pager: [phone].  03-29-14, 9:56 AM

## 2014-03-01 NOTE — Progress Notes (Signed)
Occupational Therapy Evaluation Patient Details Name: Arthur HummerRonald B Rossel MRN: [id number] DOB: 11/18/1950 Today's Date: 10/01/13    History of Present Illness 63 y/o AAM with PMH of HTN & DM who presented as a code stroke on 10/12 and had BRBPR. CT head negative. Pt underwent EGD & Colonoscopy on the evening of 10/12. Post procedure, the patient later developed vomiting and decreased mental status. 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 Pt with significant deficits as listed below and is an excellent CIR candidate. Very supportive family. Pt will benefit from skilled OT services to facilitate D/C to CIR and maximize functional level of independence. Will further assess vision next session.    Follow Up Recommendations  CIR;Supervision/Assistance - 24 hour    Equipment Recommendations  3 in 1 bedside comode    Recommendations for Other Services Rehab consult     Precautions / Restrictions Precautions Precautions: Fall      Mobility Bed Mobility Overal bed mobility: Needs Assistance Bed Mobility: Supine to Sit     Supine to sit: Min assist        Transfers Overall transfer level: Needs assistance Equipment used: 2 person hand held assist Transfers: Sit to/from [employer] CorporationStand;Stand Pivot Transfers Sit to Stand: +2 physical assistance;Mod assist Stand pivot transfers: Mod assist;+2 physical assistance       General transfer comment: Used B hand held assist to mobilize pt and given cue "let's walk". narrow scissoring gait pattern at times    Balance Overall balance assessment: Needs assistance Sitting-balance support: Feet supported;Bilateral upper extremity supported Sitting balance-Leahy Scale: Fair Sitting balance - Comments: posterior and L lat lean when  distracted Postural control: Posterior lean;Left lateral lean Standing balance support: During functional activity Standing balance-Leahy Scale: Poor                              ADL Overall ADL's : Needs assistance/impaired     Grooming: Minimal assistance;Sitting Grooming Details (indicate cue type and reason): perseveration; difficulty sequencing; difficulty problem solving;poor awareness Upper Body Bathing: Minimal assitance;Sitting   Lower Body Bathing: Moderate assistance;Sit to/from stand   Upper Body Dressing : Minimal assistance;Sitting   Lower Body Dressing: Moderate assistance;Sit to/from stand   Toilet Transfer: +2 for physical assistance;Moderate assistance;Ambulation   Toileting- Clothing Manipulation and Hygiene: Total assistance Toileting - Clothing Manipulation Details (indicate cue type and reason): pt incontinent and unaware of lying in urine     Functional mobility during ADLs: +2 for physical assistance;Moderate assistance (ataxic gait pattern) General ADL Comments: affected by attentional deficits     Vision                     Perception Perception Perception Tested?:  (will further assess)   Praxis Praxis Praxis tested?: Deficits Deficits: Perseveration;Organization Praxis-Other Comments: Pt continued to brush his teeth after stating he was finished. He then brought the toothpast to his mouth as if continuing to brush his teeth    Pertinent Vitals/Pain Pain Assessment: No/denies pain     Hand Dominance Right   Extremity/Trunk Assessment Upper Extremity Assessment Upper Extremity Assessment: RUE deficits/detail;LUE deficits/detail RUE Deficits / Details: generalized weakness LUE Deficits / Details: generalized weakness.mild ataxia LUE Sensation:  (will assess) LUE Coordination: decreased gross motor  Lower Extremity Assessment Lower Extremity Assessment: Defer to PT evaluation   Cervical / Trunk Assessment Cervical  / Trunk Assessment: Other exceptions Cervical / Trunk Exceptions: forward head   Communication Communication Communication: No difficulties   Cognition Arousal/Alertness: Awake/alert Behavior During Therapy: Flat affect Overall Cognitive Status: Impaired/Different from baseline Area of Impairment: Orientation;Attention;Memory;Awareness;Following commands;Safety/judgement;Problem solving Orientation Level: Disoriented to;Place;Time;Situation Current Attention Level: Sustained Memory: Decreased recall of precautions;Decreased short-term memory Following Commands: Follows one step commands with increased time Safety/Judgement: Decreased awareness of safety;Decreased awareness of deficits Awareness: Intellectual Problem Solving: Slow processing;Difficulty sequencing;Requires verbal cues     General Comments       Exercises       Shoulder Instructions      Home Living Family/patient expects to be discharged to:: Inpatient rehab                                        Prior Functioning/Environment Level of Independence: Independent        Comments: Has 3 PhD's    OT Diagnosis: Generalized weakness;Cognitive deficits;Disturbance of vision;Apraxia;Ataxia   OT Problem List: Decreased strength;Decreased activity tolerance;Impaired balance (sitting and/or standing);Decreased coordination;Decreased cognition;Decreased safety awareness;Impaired UE functional use   OT Treatment/Interventions: Self-care/ADL training;Neuromuscular education;DME and/or AE instruction;Therapeutic activities;Cognitive remediation/compensation;Visual/perceptual remediation/compensation;Patient/family education;Balance training    OT Goals(Current goals can be found in the care plan section) Acute Rehab OT Goals Patient Stated Goal: per wife to get back to independent OT Goal Formulation: Patient unable to participate in goal setting Time For Goal Achievement: 03/15/14 Potential to Achieve  Goals: Good  OT Frequency: Min 2X/week   Barriers to D/C:            Co-evaluation              End of Session Equipment Utilized During Treatment: Gait belt Nurse Communication: Mobility status;Other (comment) (need for chair alarm if family not present)  Activity Tolerance: Patient tolerated treatment well Patient left: in chair;with call bell/phone within reach;with family/visitor present   Time: [id number] OT Time Calculation (min): 39 min Charges:  OT General Charges $OT Visit: 1 Procedure OT Evaluation $Initial OT Evaluation Tier I: 1 Procedure OT Treatments $Self Care/Home Management : 8-22 mins G-Codes:    Jorge Retz,HILLARY 03/12/2014, 5:08 PM   [facility] Chiron Campione, OTR/L  [phone] 02/28/2014

## 2014-03-01 NOTE — Progress Notes (Signed)
  Echocardiogram Echocardiogram Transesophageal has been performed.  Georgian CoWILLIAMS, Benicio Manna 02/16/2014, 10:50 AM

## 2014-03-02 ENCOUNTER — Other Ambulatory Visit: Payer: Self-pay | Admitting: Neurology

## 2014-03-02 ENCOUNTER — Encounter (HOSPITAL_COMMUNITY): Payer: Self-pay | Admitting: Cardiology

## 2014-03-02 DIAGNOSIS — I638 Other cerebral infarction: Secondary | ICD-10-CM

## 2014-03-02 DIAGNOSIS — I639 Cerebral infarction, unspecified: Secondary | ICD-10-CM | POA: Diagnosis present

## 2014-03-02 DIAGNOSIS — I6340 Cerebral infarction due to embolism of unspecified cerebral artery: Secondary | ICD-10-CM

## 2014-03-02 DIAGNOSIS — R404 Transient alteration of awareness: Secondary | ICD-10-CM

## 2014-03-02 LAB — BASIC METABOLIC PANEL
ANION GAP: 10 (ref 5–15)
BUN: 11 mg/dL (ref 6–23)
CHLORIDE: 107 meq/L (ref 96–112)
CO2: 26 mEq/L (ref 19–32)
CREATININE: 0.83 mg/dL (ref 0.50–1.35)
Calcium: 7.9 mg/dL — ABNORMAL LOW (ref 8.4–10.5)
GFR calc Af Amer: >90 mL/min (ref >90)
Glucose, Bld: 88 mg/dL (ref 70–99)
Potassium: 3.7 mEq/L (ref 3.7–5.3)
Sodium: 143 mEq/L (ref 137–147)

## 2014-03-02 LAB — ANCA SCREEN W REFLEX TITER
Atypical p-ANCA Screen: NEGATIVE
C-ANCA SCREEN: NEGATIVE
p-ANCA Screen: NEGATIVE

## 2014-03-02 LAB — CBC
HCT: 23.4 % — ABNORMAL LOW (ref 39.0–52.0)
Hemoglobin: 8.1 g/dL — ABNORMAL LOW (ref 13.0–17.0)
MCH: 31.6 pg (ref 26.0–34.0)
MCHC: 34.6 g/dL (ref 30.0–36.0)
MCV: 91.4 fL (ref 78.0–100.0)
PLATELETS: 114 10*3/uL — AB (ref 150–400)
RBC: 2.56 MIL/uL — ABNORMAL LOW (ref 4.22–5.81)
RDW: 13.6 % (ref 11.5–15.5)
WBC: 8 10*3/uL (ref 4.0–10.5)

## 2014-03-02 LAB — GLUCOSE, CAPILLARY
Glucose-Capillary: 101 mg/dL — ABNORMAL HIGH (ref 70–99)
Glucose-Capillary: 119 mg/dL — ABNORMAL HIGH (ref 70–99)
Glucose-Capillary: 136 mg/dL — ABNORMAL HIGH (ref 70–99)
Glucose-Capillary: 157 mg/dL — ABNORMAL HIGH (ref 70–99)
Glucose-Capillary: 171 mg/dL — ABNORMAL HIGH (ref 70–99)
Glucose-Capillary: 186 mg/dL — ABNORMAL HIGH (ref 70–99)

## 2014-03-02 LAB — SICKLE CELL SCREEN: Sickle Cell Screen: NEGATIVE

## 2014-03-02 LAB — C3 COMPLEMENT: C3 COMPLEMENT: 100 mg/dL (ref 90–180)

## 2014-03-02 LAB — PHOSPHORUS: Phosphorus: 3 mg/dL (ref 2.3–4.6)

## 2014-03-02 LAB — ANTI-DNA ANTIBODY, DOUBLE-STRANDED: ds DNA Ab: 1 IU/mL

## 2014-03-02 LAB — C4 COMPLEMENT: Complement C4, Body Fluid: 20 mg/dL (ref 10–40)

## 2014-03-02 LAB — SJOGRENS SYNDROME-A EXTRACTABLE NUCLEAR ANTIBODY: SSA (Ro) (ENA) Antibody, IgG: <1

## 2014-03-02 LAB — ANA: ANA: NEGATIVE

## 2014-03-02 LAB — MAGNESIUM: Magnesium: 2.2 mg/dL (ref 1.5–2.5)

## 2014-03-02 LAB — SJOGRENS SYNDROME-B EXTRACTABLE NUCLEAR ANTIBODY: SSB (La) (ENA) Antibody, IgG: <1

## 2014-03-02 MED ORDER — LISINOPRIL 10 MG PO TABS
10.0000 mg | ORAL_TABLET | Freq: Every day | ORAL | Status: DC
  Administered 2014-03-02: 10 mg via ORAL
  Filled 2014-03-02 (×2): qty 1

## 2014-03-02 MED ORDER — HYDRALAZINE HCL 20 MG/ML IJ SOLN: 10.0000 mg | INTRAMUSCULAR | Status: DC | PRN

## 2014-03-02 MED ORDER — METOPROLOL TARTRATE 12.5 MG HALF TABLET
12.5000 mg | ORAL_TABLET | Freq: Two times a day (BID) | ORAL | Status: DC
  Administered 2014-03-02: 12.5 mg via ORAL
  Filled 2014-03-02 (×3): qty 1

## 2014-03-02 MED ORDER — ASPIRIN EC 81 MG PO TBEC
81.0000 mg | DELAYED_RELEASE_TABLET | Freq: Every day | ORAL | Status: DC
  Administered 2014-03-02: 81 mg via ORAL
  Filled 2014-03-02 (×2): qty 1

## 2014-03-02 MED ORDER — SENNOSIDES-DOCUSATE SODIUM 8.6-50 MG PO TABS
1.0000 | ORAL_TABLET | Freq: Two times a day (BID) | ORAL | Status: DC
  Administered 2014-03-02: 1 via ORAL
  Filled 2014-03-02 (×4): qty 1

## 2014-03-02 MED ORDER — INSULIN ASPART 100 UNIT/ML ~~LOC~~ SOLN
0.0000 [IU] | Freq: Three times a day (TID) | SUBCUTANEOUS | Status: DC
  Administered 2014-03-02: 2 [IU] via SUBCUTANEOUS

## 2014-03-02 MED ORDER — METOPROLOL TARTRATE 1 MG/ML IV SOLN
2.5000 mg | INTRAVENOUS | Status: DC | PRN
  Filled 2014-03-02: qty 5

## 2014-03-02 NOTE — Consult Note (Signed)
Physical Medicine and Rehabilitation Consult Reason for Consult: CVA Referring Physician: [facility]   HPI: Arthur Lawrence is a 63 y.o. right-handed male with history of hypertension, diabetes mellitus with peripheral neuropathy. Patient independent prior to admission living with his wife. Admitted 02/25/2014 after getting up to use the bathroom and noticed bright red blood in the toilet after a bowel movement. When he got up from the toilet he felt weak all over but did not fall. Family reports patient was leaning to the left with slurred speech and altered mental status. In the ED he had several more bouts of bloody stools and near syncopal event. Cranial CT scan negative for acute changes. CT angiogram head and neck showed age indeterminate infarcts involving the brainstem. 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 Gastroenterology consulted Dr. Matthias HughsBuccini for hematochezia underwent an endoscopy 03/14/2014 that showed normal study and colonoscopy that showed large amount of blood throughout the colon with no blood in the terminal ileum only suggested a colonic origin of bleeding. CT chest and abdomen negative. Neurology consulted for workup of CVA. Venous Dopplers upper and lower extremities negative except for superficial thrombus right basilic vein. Echocardiogram with ejection fraction of 65% grade 1 diastolic dysfunction. TEE completed showing no cardioembolic source. Plan for loop recorder. 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 MRA of the head unremarkable. EEG consistent with mild generalized nonspecific cerebral dysfunction and encephalopathy./ Neurology services consulted with workup ongoing. Physical therapy evaluation completed with recommendations for physical medicine  rehabilitation consult.  The patient's wife reports that he has made excellent improvements over the last day. Still notes some confusion. Has not been up with therapy today per his report. Has condom catheter   Review of Systems  Gastrointestinal: Positive for constipation.  Neurological: Positive for dizziness.  All other systems reviewed and are negative.  Past Medical History  Diagnosis Date  . Hypertension   . Allergic rhinitis   . Hyperlipidemia due to type 1 diabetes mellitus   . Type II diabetes mellitus   . History of blood transfusion 1976; 02/16/2014    "work accident; LGIB"  . Hepatitis C   . Acute lower GI bleeding 03/03/2014    "stemming from diverticulosis"   Past Surgical History  Procedure Laterality Date  . Laparoscopic cholecystectomy  2013  . Esophagogastroduodenoscopy N/A 03/17/2014    Procedure: ESOPHAGOGASTRODUODENOSCOPY (EGD);  Surgeon: Florencia Reasonsobert Buccini V, MD;  [facility];  Service: Endoscopy;  Laterality: N/A;  . Colonoscopy N/A 02/18/2014    Procedure: COLONOSCOPY;  Surgeon: Florencia Reasonsobert Buccini V, MD;  [facility];  Service: Endoscopy;  Laterality: N/A;  . Tee without cardioversion N/A 02/02/2014    Procedure: TRANSESOPHAGEAL ECHOCARDIOGRAM (TEE);  Surgeon: Lewayne BuntingBrian S Crenshaw, MD;  [facility];  Service: Cardiovascular;  Laterality: N/A;   History reviewed. No pertinent family history. Social History:  reports that he has quit smoking. His smoking use included Cigarettes. He smoked 0.00 packs per day. He has never used smokeless tobacco. He reports that he does not drink alcohol or use illicit drugs. Allergies: No Known Allergies Medications Prior to Admission  Medication Sig Dispense Refill  . aspirin EC 81 MG tablet Take 81 mg by mouth daily.      . Canagliflozin (INVOKANA) 300 MG TABS Take 300 mg by mouth daily.      .Marland Kitchen  lisinopril (PRINIVIL,ZESTRIL) 40 MG tablet Take 40 mg by mouth daily.      . metFORMIN (GLUCOPHAGE-XR) 500  MG 24 hr tablet Take 500 mg by mouth daily with breakfast.      . Omega-3 Fatty Acids (FISH OIL PO) Take 1 tablet by mouth daily.      Marland Kitchen. OVER THE COUNTER MEDICATION Take 1 tablet by mouth daily. Cut        Home: Home Living Family/patient expects to be discharged to:: Inpatient rehab Living Arrangements: Spouse/significant other Available Help at Discharge: Family Type of Home: House Home Layout: One level Home Equipment: None  Functional History: Prior Function Level of Independence: Independent Comments: Has 3 PhD's Functional Status:  Mobility: Bed Mobility Overal bed mobility: Needs Assistance Bed Mobility: Supine to Sit Supine to sit: Min assist General bed mobility comments: VCs to sit at EOB, increased time to perform task, easily distracted Transfers Overall transfer level: Needs assistance Equipment used: 2 person hand held assist Transfers: Sit to/from [employer] CorporationStand;Stand Pivot Transfers Sit to Stand: +2 physical assistance;Mod assist Stand pivot transfers: Mod assist;+2 physical assistance General transfer comment: Used B hand held assist to mobilize pt and given cue "let's walk". narrow scissoring gait pattern at times Ambulation/Gait Ambulation/Gait assistance: Mod assist;+2 physical assistance Ambulation Distance (Feet): 16 Feet Assistive device: 2 person hand held assist Gait Pattern/deviations: Step-through pattern;Decreased stride length;Shuffle;Scissoring;Ataxic;Narrow base of support Gait velocity: decreased Gait velocity interpretation: <1.8 ft/sec, indicative of risk for recurrent falls General Gait Details: Used B hand held assist to mobilize pt and given cue "let's walk". narrow scissoring gait pattern at times    ADL: ADL Overall ADL's : Needs assistance/impaired Grooming: Minimal assistance;Sitting Grooming Details (indicate cue type and reason): perseveration; difficulty sequencing; difficulty problem solving;poor awareness Upper Body Bathing: Minimal  assitance;Sitting Lower Body Bathing: Moderate assistance;Sit to/from stand Upper Body Dressing : Minimal assistance;Sitting Lower Body Dressing: Moderate assistance;Sit to/from stand Toilet Transfer: +2 for physical assistance;Moderate assistance;Ambulation Toileting- Clothing Manipulation and Hygiene: Total assistance Toileting - Clothing Manipulation Details (indicate cue type and reason): pt incontinent and unaware of lying in urine Functional mobility during ADLs: +2 for physical assistance;Moderate assistance (ataxic gait pattern) General ADL Comments: affected by attentional deficits  Cognition: Cognition Overall Cognitive Status: Impaired/Different from baseline Orientation Level: Oriented X4 Cognition Arousal/Alertness: Awake/alert Behavior During Therapy: Flat affect Overall Cognitive Status: Impaired/Different from baseline Area of Impairment: Orientation;Attention;Memory;Awareness;Following commands;Safety/judgement;Problem solving Orientation Level: Disoriented to;Place;Time;Situation Current Attention Level: Sustained Memory: Decreased recall of precautions;Decreased short-term memory Following Commands: Follows one step commands with increased time Safety/Judgement: Decreased awareness of safety;Decreased awareness of deficits Awareness: Intellectual Problem Solving: Slow processing;Difficulty sequencing;Requires verbal cues General Comments: easily distracted during tasks, required cues for attention and completion of tasks  Blood pressure 134/60, pulse 88, temperature 98.4 F (36.9 C), temperature source Oral, resp. rate 16, height 5\' 9"  (1.753 m), weight 68.7 kg (151 lb 7.3 oz), SpO2 100.00%. Physical Exam  Constitutional: He appears well-developed.  HENT:  Head: Normocephalic.  Eyes: EOM are normal.  Neck: Normal range of motion. Neck supple. No thyromegaly present.  Cardiovascular: Normal rate and regular rhythm.   Respiratory: Effort normal and breath sounds  normal. No respiratory distress.  GI: Soft. Bowel sounds are normal. He exhibits no distension.  Neurological: He is alert.  Mood is flat. He made good eye contact with examiner. He was able to state his name and age. He could not name place. Limited awareness of his deficits. He did follow simple commands  Skin: Skin  is warm and dry.   motor strength is 5/5 in the right deltoid, biceps, triceps, grip, hip flexor, knee extensors, ankle dorsiflexor plantar flexor 4 minus over 5 in the left deltoid, biceps, triceps, grip 4+/5 in the left hip flexor, knee extensors, ankle dorsiflexor plantar flexor Finger nose to finger intact on the right and with mild to moderate dysmetria on the left Heel to shin intact on the right with minimal dysmetria on the left Sensation difficult to assess appears intact to light touch however has some naming difficulties distinguishing the fingers. No evidence of intrinsic muscle atrophy Orientation is to person to hospital but not to the name of hospital. The patient states he is from [facility] and should know the name of the hospital. He gives the month as October, day incorrect as Friday and year incorrect as 1995 then 2005  Results for orders placed during the hospital encounter of March 18, 2014 (from the past 24 hour(s))  GLUCOSE, CAPILLARY     Status: Abnormal   Collection Time    02/22/2014  4:02 PM      Result Value Ref Range   Glucose-Capillary 107 (*) 70 - 99 mg/dL  HEMOGLOBIN     Status: Abnormal   Collection Time    03/11/2014  7:27 PM      Result Value Ref Range   Hemoglobin 8.5 (*) 13.0 - 17.0 g/dL  HEMATOCRIT     Status: Abnormal   Collection Time    02/17/2014  7:27 PM      Result Value Ref Range   HCT 24.8 (*) 39.0 - 52.0 %  GLUCOSE, CAPILLARY     Status: Abnormal   Collection Time    03/15/2014  8:08 PM      Result Value Ref Range   Glucose-Capillary 119 (*) 70 - 99 mg/dL  GLUCOSE, CAPILLARY     Status: Abnormal   Collection Time    03/02/14 12:09  AM      Result Value Ref Range   Glucose-Capillary 136 (*) 70 - 99 mg/dL   Comment 1 Documented in Chart     Comment 2 Notify RN    CBC     Status: Abnormal   Collection Time    03/02/14  1:48 AM      Result Value Ref Range   WBC 8.0  4.0 - 10.5 K/uL   RBC 2.56 (*) 4.22 - 5.81 MIL/uL   Hemoglobin 8.1 (*) 13.0 - 17.0 g/dL   HCT 16.1 (*) 09.6 - 04.5 %   MCV 91.4  78.0 - 100.0 fL   MCH 31.6  26.0 - 34.0 pg   MCHC 34.6  30.0 - 36.0 g/dL   RDW 40.9  81.1 - 91.4 %   Platelets 114 (*) 150 - 400 K/uL  BASIC METABOLIC PANEL     Status: Abnormal   Collection Time    03/02/14  1:48 AM      Result Value Ref Range   Sodium 143  137 - 147 mEq/L   Potassium 3.7  3.7 - 5.3 mEq/L   Chloride 107  96 - 112 mEq/L   CO2 26  19 - 32 mEq/L   Glucose, Bld 88  70 - 99 mg/dL   BUN 11  6 - 23 mg/dL   Creatinine, Ser 7.82  0.50 - 1.35 mg/dL   Calcium 7.9 (*) 8.4 - 10.5 mg/dL   GFR calc non Af Amer >90  >90 mL/min   GFR calc Af Amer >90  >90  mL/min   Anion gap 10  5 - 15  MAGNESIUM     Status: None   Collection Time    03/02/14  1:48 AM      Result Value Ref Range   Magnesium 2.2  1.5 - 2.5 mg/dL  PHOSPHORUS     Status: None   Collection Time    03/02/14  1:48 AM      Result Value Ref Range   Phosphorus 3.0  2.3 - 4.6 mg/dL  GLUCOSE, CAPILLARY     Status: Abnormal   Collection Time    03/02/14  3:56 AM      Result Value Ref Range   Glucose-Capillary 101 (*) 70 - 99 mg/dL   Comment 1 Documented in Chart     Comment 2 Notify RN    GLUCOSE, CAPILLARY     Status: Abnormal   Collection Time    03/02/14  8:16 AM      Result Value Ref Range   Glucose-Capillary 119 (*) 70 - 99 mg/dL  GLUCOSE, CAPILLARY     Status: Abnormal   Collection Time    03/02/14 11:45 AM      Result Value Ref Range   Glucose-Capillary 186 (*) 70 - 99 mg/dL   Ct Chest W Contrast  02/28/2014   CLINICAL DATA:  Initial evaluation for bloody stools last night, dizziness, left-sided weakness, confusion, MRI showed acute  infarct in the pons, concern for possible malignancy causing gastrointestinal hemorrhage  EXAM: CT CHEST, ABDOMEN, AND PELVIS WITH CONTRAST  TECHNIQUE: Multidetector CT imaging of the chest, abdomen and pelvis was performed following the standard protocol during bolus administration of intravenous contrast.  CONTRAST:  OMNIPAQUE IOHEXOL 300 MG/ML  SOLN  COMPARISON:  None.  FINDINGS: CT CHEST FINDINGS  There is mild bilateral dependent atelectasis. No pleural or pericardial effusion.  Thoracic inlet is normal. Mediastinal contents normal. Heart size normal. Mild calcification mitral valve.  No significant hilar or mediastinal adenopathy. Other than atelectasis, the lungs are clear. There are no acute musculoskeletal abnormalities involving the bony thorax.  CT ABDOMEN AND PELVIS FINDINGS  Liver is normal. Gallbladder is surgically absent and there is mild dilatation of the common bile duct which is likely the result of prior cholecystectomy.  Pancreas is normal.  Spleen is normal.  Stomach is normal.  Adrenal glands are normal. Kidneys are normal. Abdominal aorta shows no dilatation but does demonstrate mild calcification. There is mild calcification at the origin of the right renal artery as well. There is mild calcification of the iliac arteries.  A Foley catheter decompresses the bladder. Air within the lumen of the bladder is likely related to placement of Foley catheter. Reproductive organs show no acute abnormalities.  There is no ascites. There is no significant adenopathy in the abdomen or pelvis. There are no acute musculoskeletal abnormalities in the abdomen and pelvis.  Small bowel is normal. Appendix is normal. There is a mixture of stool and fluid in the rectum. Rectum is distended to about 6.7 cm. Mild narrowing mid to distal sigmoid colon may be due to peristalsis.  IMPRESSION: 1. No acute abnormality in the thorax 2. No acute abnormality in the abdomen or pelvis. 3. Fluid into the rectum  consistent with history of diarrhea and hemorrhage. No definitive specific bowel abnormalities. Narrowing sigmoid colon may simply be related to peristalsis. Underlying mass not excluded and colonoscopy or barium enema suggested if there is concern for large bowel malignancy.   Electronically Signed  By: Esperanza Heir M.D.   On: 02/28/2014 14:53   Ct Abdomen Pelvis W Contrast  02/28/2014   CLINICAL DATA:  Initial evaluation for bloody stools last night, dizziness, left-sided weakness, confusion, MRI showed acute infarct in the pons, concern for possible malignancy causing gastrointestinal hemorrhage  EXAM: CT CHEST, ABDOMEN, AND PELVIS WITH CONTRAST  TECHNIQUE: Multidetector CT imaging of the chest, abdomen and pelvis was performed following the standard protocol during bolus administration of intravenous contrast.  CONTRAST:  OMNIPAQUE IOHEXOL 300 MG/ML  SOLN  COMPARISON:  None.  FINDINGS: CT CHEST FINDINGS  There is mild bilateral dependent atelectasis. No pleural or pericardial effusion.  Thoracic inlet is normal. Mediastinal contents normal. Heart size normal. Mild calcification mitral valve.  No significant hilar or mediastinal adenopathy. Other than atelectasis, the lungs are clear. There are no acute musculoskeletal abnormalities involving the bony thorax.  CT ABDOMEN AND PELVIS FINDINGS  Liver is normal. Gallbladder is surgically absent and there is mild dilatation of the common bile duct which is likely the result of prior cholecystectomy.  Pancreas is normal.  Spleen is normal.  Stomach is normal.  Adrenal glands are normal. Kidneys are normal. Abdominal aorta shows no dilatation but does demonstrate mild calcification. There is mild calcification at the origin of the right renal artery as well. There is mild calcification of the iliac arteries.  A Foley catheter decompresses the bladder. Air within the lumen of the bladder is likely related to placement of Foley catheter. Reproductive organs  show no acute abnormalities.  There is no ascites. There is no significant adenopathy in the abdomen or pelvis. There are no acute musculoskeletal abnormalities in the abdomen and pelvis.  Small bowel is normal. Appendix is normal. There is a mixture of stool and fluid in the rectum. Rectum is distended to about 6.7 cm. Mild narrowing mid to distal sigmoid colon may be due to peristalsis.  IMPRESSION: 1. No acute abnormality in the thorax 2. No acute abnormality in the abdomen or pelvis. 3. Fluid into the rectum consistent with history of diarrhea and hemorrhage. No definitive specific bowel abnormalities. Narrowing sigmoid colon may simply be related to peristalsis. Underlying mass not excluded and colonoscopy or barium enema suggested if there is concern for large bowel malignancy.   Electronically Signed   By: Esperanza Heir M.D.   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 Does the need for close, 24 hr/day medical supervision in concert with the patient's rehab needs make it unreasonable for this patient to be served in a less intensive setting? Potentially 2. Co-Morbidities requiring supervision/potential complications: Recent GI bleeding, diabetes, hypertension 3. Due to bladder management, bowel management, safety, skin/wound care, disease management, medication administration and patient education, does the patient require 24 hr/day rehab nursing? Potentially 4. Does the patient require coordinated care of a physician, rehab nurse, PT (1-2 hrs/day, 5 days/week), OT (1-2 hrs/day, 5 days/week) and SLP (0.5-1 hrs/day, 5 days/week) to address physical and functional deficits in the context of the above medical diagnosis(es)? Potentially Addressing deficits in the following areas: balance, endurance, locomotion, strength, transferring,  bowel/bladder control, bathing, dressing, feeding, grooming, toileting and cognition 5. Can the patient actively participate in an intensive therapy program of at least 3 hrs of therapy per day at least 5 days per week? Yes 6. The potential for patient to make measurable gains while on  inpatient rehab is good 7. Anticipated functional outcomes upon discharge from inpatient rehab are supervision  with PT, supervision with OT, supervision with SLP. 8. Estimated rehab length of stay to reach the above functional goals is: 7-10d 9. Does the patient have adequate social supports to accommodate these discharge functional goals? Potentially 10. Anticipated D/C setting: Home 11. Anticipated post D/C treatments: Outpatient therapy 12. Overall Rehab/Functional Prognosis: excellent  RECOMMENDATIONS: This patient's condition is appropriate for continued rehabilitative care in the following setting: Patient still making rapid improvements, will reassess functional status Patient has agreed to participate in recommended program. Yes and Potentially Note that insurance prior authorization may be required for reimbursement for recommended care.  Comment: If patient progresses to supervision level over the next couple days he may go home with outpatient followup and family assistance. If patient remains at a min to mod assist level then recommend CIR    03/02/2014

## 2014-03-02 NOTE — Progress Notes (Signed)
[facility] PROGRESS NOTE   Arthur HummerRonald B Lawrence ZOX:[id number] DOB: 1951-05-01 DOA: 2013-05-26 PCP: [name],[name], MD  HPI/Subjective: Seen with wife at bedside, questions answered. Feels much better, denies any specific complaints.  Brief history: [name] is 63 years old African American male with past medical history of hypertension and diabetes mellitus. Patient presented the hospital with rectal bleeding and difficulty with gait/confusion. Patient admitted and gastroenterology was consulted, EGD and colonoscopy was done and showed bleeding is probably secondary to diverticulosis. While patient in the hospital he developed lethargy and transferred to step down. MRI was done and showed brainstem stroke. Stroke workup showed no source of embolic stroke (including TEE). Restarted on aspirin on 10/16.  Assessment/Plan: Active Problems:   GI bleeding   DM (diabetes mellitus)   HTN (hypertension)   Cerebral embolism with cerebral infarction   Altered mental status    GI bleed -Patient presents to the hospital with bright red blood per rectum, confusion and difficulty with gait. -EGD/colonoscopy done and showed findings consistent with probable diverticular bleed. -Currently no bleeding for the past 2 days. -Diet advanced to soft diet today, follow closely.  Acute CVA -As mentioned above admitted to the hospital as code stroke along with a GI bleeding. -MRI showed multiple supratentorial patches and small acute pontine infarct. -Per neurology not watershed area so hypotension/hemorrhagic shock does not explain multiple infarcts. -Patient had a TEE which been negative, not candidate for anticoagulation as he has recent bleeding. -Restarted on aspirin by neurology after discussed with gastroenterology.  ABLA -Acute blood loss anemia secondary to gastrointestinal bleeding. -Patient had total transfusion of 4 units of packed RBCs so far. -Patient presents with hemorrhagic shock  with blood pressure of 76/43.  Hemorrhagic shock -Secondary to acute blood loss from gastrointestinal bleeding. -As mentioned above patient presented with blood pressure of 76/43, lactate of 4.5 and bicarbonate of 13. -Elevated liver enzymes also consistent with shocked liver and hypotension.  Acute renal failure -Hemoglobin of 1.86 on admission, with metabolic acidosis with bicarbonate of 13. -Likely secondary to hypotension augmented by lisinopril. This is resolved after hydration with IV fluids.  Hypertension -Patient presented initially with hypotension likely secondary to hemorrhagic shock.  -Old hypertensive discontinued, blood pressure is increasing, I restarted lisinopril at lower dose.  Acute metabolic encephalopathy -Secondary to acute CVA and hypertension, this is resolved.  Code Status: Full code Family Communication: Plan discussed with the patient. Disposition Plan: Remains inpatient   Consultants:  None  Procedures:  None  Antibiotics:  None   Objective: Filed Vitals:   03/02/14 1352  BP: 160/72  Pulse: 77  Temp: 98.3 F (36.8 C)  Resp: 16    Intake/Output Summary (Last 24 hours) at 03/02/14 1436 Last data filed at 03/02/14 0401  Gross per 24 hour  Intake      0 ml  Output   1300 ml  Net  -1300 ml   Filed Weights   02/27/14 0245 02/27/14 0400 02/27/14 0900  Weight: 66.452 kg (146 lb 8 oz) 66 kg (145 lb 8.1 oz) 68.7 kg (151 lb 7.3 oz)    Exam: General: Alert and awake, oriented x3, not in any acute distress. HEENT: anicteric sclera, pupils reactive to light and accommodation, EOMI CVS: S1-S2 clear, no murmur rubs or gallops Chest: clear to auscultation bilaterally, no wheezing, rales or rhonchi Abdomen: soft nontender, nondistended, normal bowel sounds, no organomegaly Extremities: no cyanosis, clubbing or edema noted bilaterally Neuro: Cranial nerves II-XII intact, no focal neurological deficits  Data  Reviewed: Basic Metabolic  Panel:  Recent Labs Lab 02/15/2014 0934 02/16/2014 0953 02/27/14 0224 02/28/14 0245 2013/10/30 0130 03/02/14 0148  NA 141 141 142 151* 144 143  K 4.8 4.7 4.9 3.5* 3.2* 3.7  CL 106 109 112 117* 106 107  CO2 19  --  13* 23 29 26   GLUCOSE 323* 323* 316* 103* 94 88  BUN 31* 29* 39* 35* 15 11  CREATININE 1.28 1.30 1.86* 1.25 0.91 0.83  CALCIUM 8.7  --  7.0* 7.3* 7.4* 7.9*  MG  --   --   --   --  2.1 2.2  PHOS  --   --   --   --  2.0* 3.0   Liver Function Tests:  Recent Labs Lab 02/19/2014 0934 02/27/14 0224  AST 59* 39*  ALT 66* 44  ALKPHOS 70 42  BILITOT 0.6 1.0  PROT 6.0 4.3*  ALBUMIN 3.0* 2.2*   No results found for this basename: LIPASE, AMYLASE,  in the last 168 hours  Recent Labs Lab 02/27/14 0224  AMMONIA 68*   CBC:  Recent Labs Lab 03/07/2014 0934  02/27/14 0224 02/27/14 1007  02/28/14 0245  02/28/14 2005 2013/10/30 0130 2013/10/30 0850 2013/10/30 1927 03/02/14 0148  WBC 13.2*  --  15.3* 18.1*  --  17.0*  --   --  10.3  --   --  8.0  NEUTROABS 10.2*  --   --   --   --   --   --   --   --   --   --   --   HGB 10.5*  < > 9.3*  9.3* 10.1*  < > 8.3*  < > 8.0* 7.7*  7.8* 8.3* 8.5* 8.1*  HCT 30.6*  < > 26.5*  25.8* 28.0*  < > 23.7*  < > 22.8* 22.0*  22.1* 23.9* 24.8* 23.4*  MCV 89.5  --  88.1 85.1  --  85.6  --   --  87.4  --   --  91.4  PLT 236  --  125* 99*  --  99*  --   --  98*  --   --  114*  < > = values in this interval not displayed. Cardiac Enzymes: No results found for this basename: CKTOTAL, CKMB, CKMBINDEX, TROPONINI,  in the last 168 hours BNP (last 3 results) No results found for this basename: PROBNP,  in the last 8760 hours CBG:  Recent Labs Lab 2013/10/30 2008 03/02/14 0009 03/02/14 0356 03/02/14 0816 03/02/14 1145  GLUCAP 119* 136* 101* 119* 186*    Micro Recent Results (from the past 240 hour(s))  MRSA PCR SCREENING     Status: None   Collection Time    02/25/2014  3:12 PM      Result Value Ref Range Status   MRSA by PCR NEGATIVE   NEGATIVE Final   Comment:            The GeneXpert MRSA Assay (FDA     approved for NASAL specimens     only), is one component of a     comprehensive MRSA colonization     surveillance program. It is not     intended to diagnose MRSA     infection nor to guide or     monitor treatment for     MRSA infections.     Studies: No results found.  Scheduled Meds: .  stroke: mapping our early stages of recovery book   Does not  apply Once  . aspirin EC  81 mg Oral Daily  . insulin aspart  0-9 Units Subcutaneous TID WC  . lisinopril  10 mg Oral Daily  . metoprolol tartrate  12.5 mg Oral BID  . senna-docusate  1 tablet Oral BID  . sodium chloride  3 mL Intravenous Q12H   Continuous Infusions:      Time spent: 35 minutes    [facility] A  [facility] Pager [phone] If 7PM-7AM, please contact night-coverage at [email or web address], password [facility] 03/02/2014, 2:36 PM  LOS: 4 days

## 2014-03-02 NOTE — Progress Notes (Addendum)
STROKE TEAM PROGRESS NOTE   HISTORY Arthur Lawrence is an 63 y.o. male who reports that he awakened early this morning and went to the bathroom. He noted some blood in his stools but was able to go back to sleep. When he awakened again about 30 minutes later he had more blood in hid stools but noted on getting up that he was dizzy and had some left sided weakness. He had two more bouts of bloody stools. Family also noted some confusion and EMS was called. Code stroke was called in the ED.  Date last known well: Date: 03/09/2014  Time last known well: Time: 05:30  tPA Given: No: Active GI bleed, resolution of symptoms  SUBJECTIVE (INTERVAL HISTORY) Arthur Lawrence is at bedside. Pt much more awake alert today, not fully orientated but more himself with joking and laughing. Talked with Arthur Lawrence about his condition, and he is OK for ASA for pt at this time. He did not think pt had colon cancer based on the colonoscopy. Arthur Lawrence discussed loop recorder with pt and Arthur Lawrence, they would like to revisit it in 4 weeks at follow up with Arthur Lawrence.  OBJECTIVE Temp:  [98.1 F (36.7 C)-99.7 F (37.6 C)] 98.1 F (36.7 C) (10/16 0526) Pulse Rate:  [73-99] 73 (10/16 0526) Cardiac Rhythm:  [-] Normal sinus rhythm (10/15 2013) Resp:  [12-29] 16 (10/16 0526) BP: (134-185)/(65-96) 142/74 mmHg (10/16 0526) SpO2:  [97 %-100 %] 100 % (10/16 0526)   Recent Labs Lab 03/12/2014 1602 02/23/2014 2008 03/02/14 0009 03/02/14 0356 03/02/14 0816  GLUCAP 107* 119* 136* 101* 119*    Recent Labs Lab 02/25/2014 0934 03/04/2014 0953 02/27/14 0224 02/28/14 0245 02/19/2014 0130 03/02/14 0148  NA 141 141 142 151* 144 143  K 4.8 4.7 4.9 3.5* 3.2* 3.7  CL 106 109 112 117* 106 107  CO2 19  --  13* 23 29 26   GLUCOSE 323* 323* 316* 103* 94 88  BUN 31* 29* 39* 35* 15 11  CREATININE 1.28 1.30 1.86* 1.25 0.91 0.83  CALCIUM 8.7  --  7.0* 7.3* 7.4* 7.9*  MG  --   --   --   --  2.1 2.2  PHOS  --   --   --   --  2.0* 3.0    Recent  Labs Lab 03/16/2014 0934 02/27/14 0224  AST 59* 39*  ALT 66* 44  ALKPHOS 70 42  BILITOT 0.6 1.0  PROT 6.0 4.3*  ALBUMIN 3.0* 2.2*    Recent Labs Lab 02/16/2014 0934  02/27/14 0224 02/27/14 1007  02/28/14 0245  02/28/14 2005 02/27/2014 0130 03/15/2014 0850 03/10/2014 1927 03/02/14 0148  WBC 13.2*  --  15.3* 18.1*  --  17.0*  --   --  10.3  --   --  8.0  NEUTROABS 10.2*  --   --   --   --   --   --   --   --   --   --   --   HGB 10.5*  < > 9.3*  9.3* 10.1*  < > 8.3*  < > 8.0* 7.7*  7.8* 8.3* 8.5* 8.1*  HCT 30.6*  < > 26.5*  25.8* 28.0*  < > 23.7*  < > 22.8* 22.0*  22.1* 23.9* 24.8* 23.4*  MCV 89.5  --  88.1 85.1  --  85.6  --   --  87.4  --   --  91.4  PLT 236  --  125* 99*  --  99*  --   --  98*  --   --  114*  < > = values in this interval not displayed. No results found for this basename: CKTOTAL, CKMB, CKMBINDEX, TROPONINI,  in the last 168 hours  Recent Labs  02/27/14 1007  LABPROT 15.0  INR 1.18   No results found for this basename: COLORURINE, APPERANCEUR, LABSPEC, PHURINE, GLUCOSEU, HGBUR, BILIRUBINUR, KETONESUR, PROTEINUR, UROBILINOGEN, NITRITE, LEUKOCYTESUR,  in the last 72 hours     Component Value Date/Time   CHOL 80 02/28/2014 0245   Lab Results  Component Value Date   HGBA1C 5.7* 02/28/2014      Component Value Date/Time   LABOPIA NONE DETECTED 03/11/2014 1323   COCAINSCRNUR NONE DETECTED 03/03/2014 1323   LABBENZ NONE DETECTED 03/02/2014 1323   AMPHETMU NONE DETECTED 03/13/2014 1323   THCU NONE DETECTED 03/14/2014 1323   LABBARB NONE DETECTED 03/16/2014 1323     Recent Labs Lab 02/17/2014 0934  ETH <11    Ct Angio Head W/cm &/or Wo Cm 02/27/2014   IMPRESSION: CT head: Stable appearance of the head: Severe white matter changes suggest chronic small vessel ischemic disease. Stable pontine infarct.  CTA head: No acute vascular process or hemodynamically significant stenosis.  Mild irregularity of the intracranial vessels most consistent with  atherosclerosis.     Ct Angio Head and neck W/cm &/or Wo Cm 02/15/2014   IMPRESSION: 1. Age indeterminate infarcts involving the brainstem. 2. No significant supratentorial infarct. 3. 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 The vertebral arteries are codominant distally. 4. Minimal atherosclerotic changes at the carotid bifurcations bilaterally without significant stenosis. 5. Atherosclerotic changes within the cavernous carotid arteries bilaterally without significant stenosis. 6. Mild distal small vessel disease.    Ct Head Wo Contrast 02/21/2014   IMPRESSION: Age advanced chronic microvascular changes throughout the deep white matter.  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     Mri and Mra Brain Wo Contrast 02/27/2014   IMPRESSION: MRI HEAD:  Exam is motion degraded.  Numerous punctate and patchy supratentorial infarcts bilaterally. Acute small nonhemorrhagic infarct inferior left pons. Findings raise possibility of embolic disease.  Remote partially hemorrhagic left thalamic and right pontine infarct. Remote left pontine infarct.  Moderate small vessel disease type changes.  MRA HEAD:  Secondary to motion degradation, it is difficult to evaluate for aneurysm or adequately grade stenosis.  Flow is noted within medium and large size intracranial vessels.  Limited evaluation of branch vessels particularly right posterior inferior cerebellar artery.    Dg Chest Port 1 View 02/27/2014  IMPRESSION: No findings to suggest aspiration or pneumonia.   E  2D echo - - Normal LV function; grade 1 diastolic dysfunction; trace TR. EF 60-65%  EEG - EEG Abnormalities: 1) Mild generalized irregular delta activity.  Clinical Interpretation: This normal EEG is consistent with a  mild generalized non-specific cerebral  dysfunction(encephalopathy). There was no seizure or seizure  predisposition recorded on this study.   Venous doppler -  Bilateral upper extremity venous duplex= No  evidence of DVT bilaterally. Superficial thrombosis is noted in the right basilic vein at the [facility] (IV site).  Bilateral lower extremity venous duplex= No evidence of DVT bilaterally.  Pan CT - 1. No acute abnormality in the thorax  2. No acute abnormality in the abdomen or pelvis.  3. Fluid into the rectum consistent with history of diarrhea and  hemorrhage. No definitive specific bowel abnormalities. Narrowing  sigmoid colon may simply be related to peristalsis.  Underlying mass  not excluded and colonoscopy or barium enema suggested if there is  concern for large bowel malignancy.   TEE - normal LV function; negative saline microcavitation study.  Hypercoagulable work up - pending  LDL 22 and A1C 5.7  PHYSICAL EXAM  Temp:  [98.1 F (36.7 C)-99.7 F (37.6 C)] 98.1 F (36.7 C) (10/16 0526) Pulse Rate:  [73-99] 73 (10/16 0526) Resp:  [12-29] 16 (10/16 0526) BP: (134-185)/(65-96) 142/74 mmHg (10/16 0526) SpO2:  [97 %-100 %] 100 % (10/16 0526)  General - Well nourished, well developed, in no acute distress.  Ophthalmologic - not cooperative on exam.  Cardiovascular - Regular rhythm with no murmur.  Mental Status -  Awake alert and orientated x 3, but not to year.  Answer with short answers, mild psychomotor slowing, able to follow simple commands and naming and repetition preserved.   Cranial Nerves II - XII - II - blink bilaterally to visual threat. III, IV, VI - Extraocular movements intact. V - Facial sensation intact bilaterally. VII - right nasolabial fold flattening. VIII - Hearing & vestibular intact bilaterally. X - Palate elevates symmetrically. XI - Chin turning & shoulder shrug intact bilaterally. XII - Tongue protrusion intact.  Motor Strength - The patient's strength was 5/5 RUE and 4/5 LUE and pronator drift was present at the left, 5-/5 at LLE proximal and distal. Bulk was normal and fasciculations were absent.    Reflexes - The patient's reflexes were 1+  in all extremities and he had positive right babinski but mute left babinski.  Sensory - Light touch, temperature/pinprick were assessed and were symmetrical.    Coordination - The patient had normal movements in the hands with no ataxia or dysmetria.  Tremor was absent.  Gait and Station - not tested  ASSESSMENT/PLAN Arthur Lawrence is a 63 y.o. male with history of HTN, DM presenting with lower GI bleeding. His last colonoscopy was 2004 when ployp removed and biopsy showed benign. He developed slurry speech and left hemiparesis but improved in ER. He did not receive IV t-PA due to GI bleeding and improving symptoms. 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   Stroke:  acute pontine stroke with embolic shower at anterior circulation with multiple old lacunar pontine hemorrhage and infarct - etiology not clear  MRI showed small vessel events including acute and chronic pontine hemorrhage and infarct as well as cardioembolic pattern with anterior circulation embolic shower  CTA head and neck - no significant large vessel stenosis  Venous doppler no DVT seen  TEE no cardiac source of emboli   Pan CT no evidence of malignancy but not able to rule out colon cancer.  Colonoscopy not suggest colon cancer as per Dr. Matthias Hughs  2D Echo  unremarkable  LDL 22, within the goal  HgbA1c 5.7, within the goal  SCDs for VTE prophylaxis  Resultant psychomotor slowing and flat affect   aspirin 81 mg orally every day prior to admission, put back on ASA 81mg  for stroke prevention after talking with Dr. Matthias Hughs  Risk factor education  Ongoing aggressive risk factor management  At this time, pt is not a good candidate for anticoagulation. Will discuss about loop recorder as outpt follow up with Dr. Johney Frame in 4 weeks.   Pt will be candidate for anticoagulation in  about 2 weeks as per Dr. Matthias Hughs.  Hypertension  Home meds:  lisinopril  Stable at 140-160 now  Pt BP can be gradually managed to normotensive   LGIB - OK to have regular diet, will start with soft diet. - currently bleeding stopped - s/p 4 x PRBC transfusion - H&H stable at 8.0-8.5 - no concerning for malignancy as per Dr. Matthias Hughs.  - CEA normal range - added laxatives for bowel movement  Other Stroke Risk Factors Advanced age Former cigarette smoker, quit 40 years   Family hx stroke (mom)  Other Active Problems  High Cre - likely contrast related - resolved   Hospital day # 4  Arthur See PA-C [facility] Pager [phone] 03/02/2014, 9:40 AM  I, the attending vascular neurologist, have personally obtained a history, examined the patient, evaluated laboratory data, individually viewed imaging studies, and formulated the assessment and plan of care.  I have made any additions or clarifications directly to the above note and agree with the findings and plan as currently documented.   Neurology will sign off. Please call with questions. Pt will follow up with Dr. Roda Shutters at [facility] in about 6 weeks. Thanks for the consult.  Marvel Plan, MD PhD Stroke Neurology 03/02/2014 9:40 AM    To contact Stroke Continuity provider, please refer to [email or web address]. After hours, contact General Neurology

## 2014-03-02 NOTE — Progress Notes (Signed)
Chaplain visited with pt with family at bedside. Pt reported feeling and doing better than he was upon arrival. Wife is also visibly better and expressed that she was.   Pt and family have strong support with them.   Page if needed.  Gala RomneyBrown, Chestine Belknap J, Chaplain 03/02/2014

## 2014-03-02 NOTE — Progress Notes (Signed)
Pt BP 162/69. Simmonds, MD paged. New orders for metoprolol and hydralazine placed. Will continue to monitor patient.

## 2014-03-02 NOTE — Progress Notes (Signed)
I have made the patient and his wife, and his hospitalist, aware of his positive hepatitis C antibody test.  The patient does have elevated liver chemistries and, on conversation with the patient and his wife, it sounds as though he never received treatment for his hepatitis C infection. 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  Recommendation: 1. Check hepatitis C viral quantitation 2. I have recommended that the patient be seen at a hepatitis C treatment clinic because nontoxic, highly effective therapies for this condition are now available. A referral for this can be made either through his primary physician's office, or if they prefer, they can come to my office to make those arrangements. I gave him my card for that purpose, and especially importance of getting this addressed, once he is over his current acute problems.  Florencia Reasonsobert V. Lashaun Krapf, M.D. [phone]

## 2014-03-03 ENCOUNTER — Inpatient Hospital Stay (HOSPITAL_COMMUNITY): Payer: BC Managed Care – PPO

## 2014-03-03 DIAGNOSIS — K117 Disturbances of salivary secretion: Secondary | ICD-10-CM

## 2014-03-03 DIAGNOSIS — Z515 Encounter for palliative care: Secondary | ICD-10-CM

## 2014-03-03 LAB — CBC
HEMATOCRIT: 18.4 % — AB (ref 39.0–52.0)
HEMOGLOBIN: 6.5 g/dL — AB (ref 13.0–17.0)
MCH: 32 pg (ref 26.0–34.0)
MCHC: 35.3 g/dL (ref 30.0–36.0)
MCV: 90.6 fL (ref 78.0–100.0)
Platelets: 187 10*3/uL (ref 150–400)
RBC: 2.03 MIL/uL — AB (ref 4.22–5.81)
RDW: 13.8 % (ref 11.5–15.5)
WBC: 11.5 10*3/uL — ABNORMAL HIGH (ref 4.0–10.5)

## 2014-03-03 LAB — COMPREHENSIVE METABOLIC PANEL
ALBUMIN: 2 g/dL — AB (ref 3.5–5.2)
ALT: 37 U/L (ref 0–53)
ANION GAP: 12 (ref 5–15)
AST: 28 U/L (ref 0–37)
Alkaline Phosphatase: 81 U/L (ref 39–117)
BUN: 19 mg/dL (ref 6–23)
CALCIUM: 7.7 mg/dL — AB (ref 8.4–10.5)
CO2: 21 mEq/L (ref 19–32)
Chloride: 106 mEq/L (ref 96–112)
Creatinine, Ser: 0.98 mg/dL (ref 0.50–1.35)
GFR calc non Af Amer: 86 mL/min — ABNORMAL LOW (ref >90)
GLUCOSE: 242 mg/dL — AB (ref 70–99)
Potassium: 4.3 mEq/L (ref 3.7–5.3)
Sodium: 139 mEq/L (ref 137–147)
TOTAL PROTEIN: 4.3 g/dL — AB (ref 6.0–8.3)
Total Bilirubin: 0.6 mg/dL (ref 0.3–1.2)

## 2014-03-03 LAB — HIV ANTIBODY (ROUTINE TESTING W REFLEX): HIV 1&2 Ab, 4th Generation: NONREACTIVE

## 2014-03-03 LAB — PREPARE RBC (CROSSMATCH)

## 2014-03-03 LAB — GLUCOSE, CAPILLARY: GLUCOSE-CAPILLARY: 197 mg/dL — AB (ref 70–99)

## 2014-03-03 MED ORDER — LEVETIRACETAM IN NACL 1000 MG/100ML IV SOLN
1000.0000 mg | Freq: Once | INTRAVENOUS | Status: AC
  Administered 2014-03-03: 1000 mg via INTRAVENOUS
  Filled 2014-03-03: qty 100

## 2014-03-03 MED ORDER — LORAZEPAM 2 MG/ML IJ SOLN
INTRAMUSCULAR | Status: AC
  Administered 2014-03-03: 1 mg
  Filled 2014-03-03: qty 1

## 2014-03-03 MED ORDER — SODIUM CHLORIDE 0.9 % IV SOLN
Freq: Once | INTRAVENOUS | Status: AC
  Administered 2014-03-03: 07:00:00 via INTRAVENOUS

## 2014-03-03 MED ORDER — MORPHINE SULFATE 2 MG/ML IJ SOLN: 1.0000 mg | INTRAMUSCULAR | Status: DC | PRN

## 2014-03-03 MED ORDER — LORAZEPAM 2 MG/ML IJ SOLN
1.0000 mg | INTRAMUSCULAR | Status: DC | PRN
  Administered 2014-03-03: 1 mg via INTRAVENOUS
  Administered 2014-03-05: 2 mg via INTRAVENOUS
  Filled 2014-03-03 (×2): qty 1

## 2014-03-03 MED ORDER — ATROPINE SULFATE 1 % OP SOLN
4.0000 [drp] | Freq: Four times a day (QID) | OPHTHALMIC | Status: DC | PRN
  Administered 2014-03-03 – 2014-03-05 (×3): 4 [drp] via SUBLINGUAL
  Filled 2014-03-03 (×3): qty 2

## 2014-03-03 MED ORDER — SODIUM CHLORIDE 0.9 % IV SOLN
INTRAVENOUS | Status: DC
  Administered 2014-03-03: 07:00:00 via INTRAVENOUS

## 2014-03-03 MED ORDER — SODIUM CHLORIDE 0.9 % IV BOLUS (SEPSIS): 1000.0000 mL | Freq: Once | INTRAVENOUS | Status: DC

## 2014-03-03 MED ORDER — MORPHINE SULFATE 2 MG/ML IJ SOLN
2.0000 mg | INTRAMUSCULAR | Status: DC | PRN
  Administered 2014-03-03 – 2014-03-05 (×7): 2 mg via INTRAVENOUS
  Filled 2014-03-03 (×7): qty 1

## 2014-03-03 MED ORDER — SCOPOLAMINE 1 MG/3DAYS TD PT72
1.0000 | MEDICATED_PATCH | TRANSDERMAL | Status: DC
  Administered 2014-03-03: 1.5 mg via TRANSDERMAL
  Filled 2014-03-03 (×2): qty 1

## 2014-03-03 NOTE — Addendum Note (Signed)
Addended by: Marvel PlanXU, Danil Wedge on: 03/03/2014 11:16 PM   Modules accepted: Orders

## 2014-03-03 NOTE — Progress Notes (Signed)
[phone] wife of Pt came to nurses station stating that pt was very restless. RN went to pt room and asked pt if he would want something to help him sleep. Pt stated "No I'm okay". RN asked pt if there was anything else that could be done for him to help him get some rest "pt stated no".  RN reminded pt to call if needing anything. RN checked on pt around 0400, pt and wife were sleeping. Pts wife called nurses station asking for the RN to come to the room around 0500. Upon assessment pt was just staring and not answering any questions nor following commands. RN asked pt to state his name and there was no response. RN asked pt to squeeze hands and there was not any movement. HR was 115 and SPo2 100%. RN informed charge nurse and called Rapid response. Claiborne Billingsallahan, NP  and Neurology paged. Neurology saw pt and ordered CT, EEG, 1 mg Ativan. Vitals were BP 105/64  T 101 (rectally), P 115, R 16, SPo2 100. Upon taking rectal temp pt began stoolling a large amount of blood. 1 mg ativan given and Neurology put in orders to transfer pt to ICU. Rapid response and RN transported pt to CT then to 2S16. Report given at bedside.

## 2014-03-03 NOTE — Progress Notes (Signed)
Pt BP 169/76. Order parameters not met to treat high blood pressure. Will continue to monitor pt.

## 2014-03-03 NOTE — Progress Notes (Signed)
CRITICAL VALUE ALERT  Critical value received:  Hgb 6.5  Date of notification:  03/03/14  Time of notification:  0624  Critical value read back: yes  Nurse who received alert:  Ceairra Mccarver, Joan MayansKristina Nicole  MD notified (1st page):  Claiborne Billingsallahan, NP  Time of first page:  [phone]  MD notified (2nd page):  Time of second page:  Responding MD:  Claiborne Billingsallahan  Time MD responded:  [phone]

## 2014-03-03 NOTE — Significant Event (Addendum)
Rapid Response Event Note Called to room per RN for pt with AMS. Initially admitted for Stroke work up and GIB.  Overview: Time Called: 0552 Arrival Time: 0555 Event Type: Neurologic  Initial Focused Assessment: Per RN pt last seen normal around 0200. This morning around 0545 RN assessed pt lethargic and unresponsive. Pt found lying in bed with gaze to the right. Eyes blinking in a rhythmic pattern and jaw twitching. Pt states "No " to pain, speech clear. Inconsistently makes eyes contact, eyes move midline occasionally. Will not follow commands and closes eyes after blinking stops.  Interventions: Dr Wyatt PortelaSomner paged to bedside. CT scan , keppra load, and EEG ordered. 1mg  Ativan orderd and given. Large bloody stool found and cleaned prior to transport. Lenny Pastelom Callahan NP updated on pt large BM and rectal temp 101.1 Pt taken to CT scan and [place] [facility] [place]. Wife at bedside updated on pt plan of care, guided to waiting room. 2 Runner, broadcasting/film/videosouth RN to monitor.    Event Summary: Name of Physician Notified: Lenny Pastelom Callahan NP at [phone]  Name of Consulting Physician Notified: Dr. Wyatt PortelaSomner at [phone]  Outcome: Transferred (Comment)     Cliffton AstersWhite, James IvanoffBrooke Leigh Arlow Spiers

## 2014-03-03 NOTE — Progress Notes (Signed)
STROKE TEAM PROGRESS NOTE   HISTORY Arthur Lawrence is an 63 y.o. male who reports that he awakened early this morning and went to the bathroom. He noted some blood in his stools but was able to go back to sleep. When he awakened again about 30 minutes later he had more blood in hid stools but noted on getting up that he was dizzy and had some left sided weakness. He had two more bouts of bloody stools. Family also noted some confusion and EMS was called. Code stroke was called in the ED.  Date last known well: Date: 03/11/2014  Time last known well: Time: 05:30  tPA Given: No: Active GI bleed, resolution of symptoms  SUBJECTIVE (INTERVAL HISTORY) Wife is at bedside. Pt had overnight changes. New GI bleeding, large amount and Hb down to 6.5. Also had neuro changes, not responsive and staring with eyelid blinking suspicous for seizure. Was given ativan and loaded with keppra. This morning wife requested palliative care.   OBJECTIVE Temp:  [99.1 F (37.3 C)-101.1 F (38.4 C)] 100.6 F (38.1 C) (10/17 1100) Pulse Rate:  [50-124] 50 (10/17 1429) Cardiac Rhythm:  [-] Sinus tachycardia (10/17 0800) Resp:  [16-29] 18 (10/17 1429) BP: (95-169)/(50-77) 106/50 mmHg (10/17 1429) SpO2:  [98 %-100 %] 100 % (10/17 1429) FiO2 (%):  [2 %] 2 % (10/17 1429)   Recent Labs Lab 03/02/14 0816 03/02/14 1145 03/02/14 1608 03/02/14 1958 03/03/14 0004  GLUCAP 119* 186* 171* 157* 197*    Recent Labs Lab 02/27/14 0224 02/28/14 0245 January 16, 2014 0130 03/02/14 0148 03/03/14 0503  NA 142 151* 144 143 139  K 4.9 3.5* 3.2* 3.7 4.3  CL 112 117* 106 107 106  CO2 13* 23 29 26 21   GLUCOSE 316* 103* 94 88 242*  BUN 39* 35* 15 11 19   CREATININE 1.86* 1.25 0.91 0.83 0.98  CALCIUM 7.0* 7.3* 7.4* 7.9* 7.7*  MG  --   --  2.1 2.2  --   PHOS  --   --  2.0* 3.0  --     Recent Labs Lab 03/16/2014 0934 02/27/14 0224 03/03/14 0503  AST 59* 39* 28  ALT 66* 44 37  ALKPHOS 70 42 81  BILITOT 0.6 1.0 0.6  PROT 6.0  4.3* 4.3*  ALBUMIN 3.0* 2.2* 2.0*    Recent Labs Lab 03/15/2014 0934  02/27/14 1007  02/28/14 0245  January 16, 2014 0130 January 16, 2014 0850 January 16, 2014 1927 03/02/14 0148 03/03/14 0503  WBC 13.2*  < > 18.1*  --  17.0*  --  10.3  --   --  8.0 11.5*  NEUTROABS 10.2*  --   --   --   --   --   --   --   --   --   --   HGB 10.5*  < > 10.1*  < > 8.3*  < > 7.7*  7.8* 8.3* 8.5* 8.1* 6.5*  HCT 30.6*  < > 28.0*  < > 23.7*  < > 22.0*  22.1* 23.9* 24.8* 23.4* 18.4*  MCV 89.5  < > 85.1  --  85.6  --  87.4  --   --  91.4 90.6  PLT 236  < > 99*  --  99*  --  98*  --   --  114* 187  < > = values in this interval not displayed. No results found for this basename: CKTOTAL, CKMB, CKMBINDEX, TROPONINI,  in the last 168 hours No results found for this basename: LABPROT, INR,  in the last 72 hours No results found for this basename: COLORURINE, APPERANCEUR, LABSPEC, PHURINE, GLUCOSEU, HGBUR, BILIRUBINUR, KETONESUR, PROTEINUR, UROBILINOGEN, NITRITE, LEUKOCYTESUR,  in the last 72 hours     Component Value Date/Time   CHOL 80 02/28/2014 0245   Lab Results  Component Value Date   HGBA1C 5.7* 02/28/2014      Component Value Date/Time   LABOPIA NONE DETECTED March 20, 2014 1323   COCAINSCRNUR NONE DETECTED 03-20-14 1323   LABBENZ NONE DETECTED 03-20-14 1323   AMPHETMU NONE DETECTED 03/20/2014 1323   THCU NONE DETECTED 2014-03-20 1323   LABBARB NONE DETECTED 2014-03-20 1323     Recent Labs Lab March 20, 2014 0934  ETH <11    Ct Angio Head W/cm &/or Wo Cm 02/27/2014   IMPRESSION: CT head: Stable appearance of the head: Severe white matter changes suggest chronic small vessel ischemic disease. Stable pontine infarct.  CTA head: No acute vascular process or hemodynamically significant stenosis.  Mild irregularity of the intracranial vessels most consistent with atherosclerosis.     Ct Angio Head and neck W/cm &/or Wo Cm 2014/03/20   IMPRESSION: 1. Age indeterminate infarcts involving the brainstem. 2. No significant  supratentorial infarct. 3. 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 The vertebral arteries are codominant distally. 4. Minimal atherosclerotic changes at the carotid bifurcations bilaterally without significant stenosis. 5. Atherosclerotic changes within the cavernous carotid arteries bilaterally without significant stenosis. 6. Mild distal small vessel disease.    Ct Head Wo Contrast 03/03/14 Left thalamus lacunar infarction that has occurred since 02/27/2014  MRI. The majority of punctate infarcts noted 02/27/2014 are not  discretely seen by CT; infarct progression may be underestimated on  this study. 2014-03-20   IMPRESSION: Age advanced chronic microvascular changes throughout the deep white matter.  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     Mri and Mra Brain Wo Contrast 02/27/2014   IMPRESSION: MRI HEAD:  Exam is motion degraded.  Numerous punctate and patchy supratentorial infarcts bilaterally. Acute small nonhemorrhagic infarct inferior left pons. Findings raise possibility of embolic disease.  Remote partially hemorrhagic left thalamic and right pontine infarct. Remote left pontine infarct.  Moderate small vessel disease type changes.  MRA HEAD:  Secondary to motion degradation, it is difficult to evaluate for aneurysm or adequately grade stenosis.  Flow is noted within medium and large size intracranial vessels.  Limited evaluation of branch vessels particularly right posterior inferior cerebellar artery.    Dg Chest Port 1 View 02/27/2014  IMPRESSION: No findings to suggest aspiration or pneumonia.   E  2D echo - - Normal LV function; grade 1 diastolic dysfunction; trace TR. EF 60-65%  EEG - EEG Abnormalities: 1) Mild generalized irregular delta activity.  Clinical Interpretation: This normal EEG is consistent with a  mild generalized non-specific cerebral  dysfunction(encephalopathy). There was no seizure or seizure  predisposition recorded on this study.    Venous doppler -  Bilateral upper extremity venous duplex= No evidence of DVT bilaterally. Superficial thrombosis is noted in the right basilic vein at the [facility] (IV site).  Bilateral lower extremity venous duplex= No evidence of DVT bilaterally.  Pan CT - 1. No acute abnormality in the thorax  2. No acute abnormality in the abdomen or pelvis.  3. Fluid into the rectum consistent with history of diarrhea and  hemorrhage. No definitive specific bowel abnormalities. Narrowing  sigmoid colon may simply be related to peristalsis. Underlying mass  not excluded and colonoscopy or barium enema suggested if there is  concern for large bowel malignancy.   TEE - normal LV function; negative saline microcavitation study.  Hypercoagulable work up - negative  Component     Latest Ref Rng 03/17/2014  Anticardiolipin IgA     <22 APL U/mL 8 (L)  Anticardiolipin IgG     <23 GPL U/mL 3 (L)  Anticardiolipin IgM     <11 MPL U/mL 0 (L)  PTT Lupus Anticoagulant     28.0 - 43.0 secs 26.4 (L)  PTTLA Confirmation     <8.0 secs NOT APPL  PTTLA 4:1 Mix     28.0 - 43.0 secs NOT APPL  DRVVT     <42.9 secs 34.6  Drvvt confirmation     <1.15 Ratio NOT APPL  dRVVT Incubated 1:1 Mix     <42.9 secs NOT APPL  Lupus Anticoagulant     NOT DETECTED NOT DETECTED  Phosphatydalserine, IgG      PENDING  Phosphatydalserine, IgM      PENDING  Phosphatydalserine, IgA      PENDING  c-ANCA Screen     NEGATIVE NEGATIVE  p-ANCA Screen     NEGATIVE NEGATIVE  Atypical p-ANCA Screen     NEGATIVE NEGATIVE  ANA     NEGATIVE NEGATIVE  C3 Complement     90 - 180 mg/dL 960100  Complement C4, Body Fluid     10 - 40 mg/dL 20  ds DNA Ab      1  SSA (Ro) (ENA) Antibody, IgG     <1.0 NEG AI <1.0 NEG  SSB (La) (ENA) Antibody, IgG     <1.0 NEG AI <1.0 NEG  Rheumatoid Factor     <=14 IU/mL <10  Homocysteine     4.0 - 15.4 umol/L 13.8  Sickle Cell Screen     NEGATIVE NEGATIVE    LDL 22 and A1C 5.7  PHYSICAL  EXAM  Temp:  [99.1 F (37.3 C)-101.1 F (38.4 C)] 100.6 F (38.1 C) (10/17 1100) Pulse Rate:  [50-124] 50 (10/17 1429) Resp:  [16-29] 18 (10/17 1429) BP: (95-169)/(50-77) 106/50 mmHg (10/17 1429) SpO2:  [98 %-100 %] 100 % (10/17 1429) FiO2 (%):  [2 %] 2 % (10/17 1429)  Limited exam as pt had been on comfort care. Pt unresponsive, no respiratory distress, wife at bedside. Not open eyes, not following commands and no spontaneous movement of all extremities.   ASSESSMENT/PLAN Arthur Lawrence is a 63 y.o. male with history of HTN, DM presenting with lower GI bleeding. His last colonoscopy was 2004 when ployp removed and biopsy showed benign. He developed slurry speech and left hemiparesis but improved in ER. He did not receive IV t-PA due to GI bleeding and improving symptoms. 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 Pt was gradually improved mental status, stroke work up negative, had EEG and no loop recorder placed yet. Overnight, had clinic changes, recurrent GI bleeding with unresponsiveness. Family requested palliative care.  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  Colonoscopy not suggest colon cancer as per Dr. Matthias Hughs  2D Echo  unremarkable  LDL 22, within the goal  HgbA1c 5.7, within the goal  SCDs for VTE prophylaxis  Resultant psychomotor slowing and flat affect   Risk  factor education  Ongoing aggressive risk factor management  Pt overnight clinical changes, again with GI bleeding and unresponsiveness. Requested palliative care by wife.  LGIB - OK to have regular diet, will start with soft diet. - currently bleeding stopped - s/p 4 x PRBC transfusion - H&H stable at 8.0-8.5 - no concerning for malignancy as per Dr. Matthias Hughs.  - CEA normal range - recurrent GIB with Hb drop, family wants palliative care.  Other Stroke Risk Factors Advanced age Former cigarette smoker, quit 40 years   Family hx stroke (mom)  Other Active Problems  High Cre - likely contrast related - resolved   Hospital day # 5  Neurology will sign off. Please call with questions. Thanks for the consult.  Marvel Plan, MD PhD Stroke Neurology 03/03/2014 11:04 PM    To contact Stroke Continuity provider, please refer to [email or web address]. After hours, contact General Neurology

## 2014-03-03 NOTE — Progress Notes (Signed)
Chaplain noticed family in waiting room and checked in to see how wife was doing. Wife told chaplain that early this morning her husband took a turn for the worse. Chaplain affirmed that pt wife is juggling a lot right now. Pt wife says that despite this turn she has "peace." Page chaplain if needed.  03/03/14 0800  Clinical Encounter Type  Visited With Family  Visit Type Follow-up  StameyLoa Socks, Tell Rozelle F, Chaplain 03/03/2014 8:50 AM

## 2014-03-03 NOTE — Progress Notes (Signed)
Chaplain Note: Responded to RN request to support family after patient was made DNR. Visited with patient's wife at bedside... patient did not wake up. Provided faith affirmation and emotional support for patient's wife. Prayed with her.  Patient's [facility].  Rutherford NailLeah Smith, Chaplain

## 2014-03-03 NOTE — Progress Notes (Signed)
[facility] PROGRESS NOTE   Arthur Lawrence WUJ:[id number] DOB: [birth year] DOA: 03/03/2014 PCP: Maryelizabeth Rowan, MD  HPI/Subjective: Rapid response called overnight, patient developed temperature of 101.1 and altered mental status. Currently does not respond to painful stimuli. Seen lying on bright red bloody bowel movement Had Lawrence long discussion with his wife, with the presence of Mrs. Amy RN. Wife wants to order his will, she wants full comfort. Consults palliative, stop all interventions, morphine for comfort.  Brief history: Arthur Lawrence is 63 years old African American male with past medical history of hypertension and diabetes mellitus. Patient presented the hospital with rectal bleeding and difficulty with gait/confusion. Patient admitted and gastroenterology was consulted, EGD and colonoscopy was done and showed bleeding is probably secondary to diverticulosis. While patient in the hospital he developed lethargy and transferred to step down. MRI was done and showed brainstem stroke. Stroke workup showed no source of embolic stroke (including TEE). Restarted on aspirin on 10/16.  Assessment/Plan: Active Problems:   GI bleeding   DM (diabetes mellitus)   HTN (hypertension)   Cerebral embolism with cerebral infarction   Altered mental status   Brain stem infarction    SIRS -Temperature of 101.1, heart rate of 115 and hypotension of 98/59. -Likely secondary to hemorrhagic shock from recurrent bleeding. -Currently is unresponsive even to painful stimuli.  GI bleed -Patient presents to the hospital with bright red blood per rectum, confusion and difficulty with gait. -EGD/colonoscopy done and showed findings consistent with probable diverticular bleed. -Currently no bleeding for the past 2 days. -Diet advanced to soft diet today, follow closely.  Acute CVA -As mentioned above admitted to the hospital as code stroke along with Lawrence GI bleeding. -MRI showed multiple  supratentorial patches and small acute pontine infarct. -Per neurology not watershed area so hypotension/hemorrhagic shock does not explain multiple infarcts. -Patient had Lawrence TEE which been negative, not candidate for anticoagulation as he has recent bleeding. -Restarted on aspirin by neurology after discussed with gastroenterology.  ABLA -Acute blood loss anemia secondary to gastrointestinal bleeding. -Patient had total transfusion of 4 units of packed RBCs so far. -Patient presents with hemorrhagic shock with blood pressure of 76/43.  Hemorrhagic shock -Secondary to acute blood loss from gastrointestinal bleeding. -As mentioned above patient presented with blood pressure of 76/43, lactate of 4.5 and bicarbonate of 13. -Elevated liver enzymes also consistent with shocked liver and hypotension.  Acute renal failure -Hemoglobin of 1.86 on admission, with metabolic acidosis with bicarbonate of 13. -Likely secondary to hypotension augmented by lisinopril. This is resolved after hydration with IV fluids.  Hypertension -Patient presented initially with hypotension likely secondary to hemorrhagic shock.  -Old hypertensive discontinued, blood pressure is increasing, I restarted lisinopril at lower dose.  Acute metabolic encephalopathy -Secondary to acute CVA and hypertension, this is resolved.  Hepatitis C -Quantitative PCR sent  Code Status: Full code Family Communication: Plan discussed with the patient. Disposition Plan: Remains inpatient   Consultants:  None  Procedures:  None  Antibiotics:  None   Objective: Filed Vitals:   03/03/14 0900  BP: 122/67  Pulse: 116  Temp:   Resp: 23    Intake/Output Summary (Last 24 hours) at 03/03/14 0927 Last data filed at 03/03/14 9562  Gross per 24 hour  Intake    105 ml  Output   2370 ml  Net  -2265 ml   Filed Weights   02/27/14 0245 02/27/14 0400 02/27/14 0900  Weight: 66.452 kg (146 lb 8 oz) 66 kg (145  lb 8.1 oz) 68.7  kg (151 lb 7.3 oz)    Exam: General: Alert and awake, oriented x3, not in any acute distress. HEENT: anicteric sclera, pupils reactive to light and accommodation, EOMI CVS: S1-S2 clear, no murmur rubs or gallops Chest: clear to auscultation bilaterally, no wheezing, rales or rhonchi Abdomen: soft nontender, nondistended, normal bowel sounds, no organomegaly Extremities: no cyanosis, clubbing or edema noted bilaterally Neuro: Cranial nerves II-XII intact, no focal neurological deficits  Data Reviewed: Basic Metabolic Panel:  Recent Labs Lab 02/27/14 0224 02/28/14 0245 03/16/2014 0130 03/02/14 0148 03/03/14 0503  NA 142 151* 144 143 139  K 4.9 3.5* 3.2* 3.7 4.3  CL 112 117* 106 107 106  CO2 13* 23 29 26 21   GLUCOSE 316* 103* 94 88 242*  BUN 39* 35* 15 11 19   CREATININE 1.86* 1.25 0.91 0.83 0.98  CALCIUM 7.0* 7.3* 7.4* 7.9* 7.7*  MG  --   --  2.1 2.2  --   PHOS  --   --  2.0* 3.0  --    Liver Function Tests:  Recent Labs Lab 02/20/2014 0934 02/27/14 0224 03/03/14 0503  AST 59* 39* 28  ALT 66* 44 37  ALKPHOS 70 42 81  BILITOT 0.6 1.0 0.6  PROT 6.0 4.3* 4.3*  ALBUMIN 3.0* 2.2* 2.0*   No results found for this basename: LIPASE, AMYLASE,  in the last 168 hours  Recent Labs Lab 02/27/14 0224  AMMONIA 68*   CBC:  Recent Labs Lab 02/24/2014 0934  02/27/14 1007  02/28/14 0245  02/25/2014 0130 02/28/2014 0850 02/22/2014 1927 03/02/14 0148 03/03/14 0503  WBC 13.2*  < > 18.1*  --  17.0*  --  10.3  --   --  8.0 11.5*  NEUTROABS 10.2*  --   --   --   --   --   --   --   --   --   --   HGB 10.5*  < > 10.1*  < > 8.3*  < > 7.7*  7.8* 8.3* 8.5* 8.1* 6.5*  HCT 30.6*  < > 28.0*  < > 23.7*  < > 22.0*  22.1* 23.9* 24.8* 23.4* 18.4*  MCV 89.5  < > 85.1  --  85.6  --  87.4  --   --  91.4 90.6  PLT 236  < > 99*  --  99*  --  98*  --   --  114* 187  < > = values in this interval not displayed. Cardiac Enzymes: No results found for this basename: CKTOTAL, CKMB, CKMBINDEX,  TROPONINI,  in the last 168 hours BNP (last 3 results) No results found for this basename: PROBNP,  in the last 8760 hours CBG:  Recent Labs Lab 03/02/14 0816 03/02/14 1145 03/02/14 1608 03/02/14 1958 03/03/14 0004  GLUCAP 119* 186* 171* 157* 197*    Micro Recent Results (from the past 240 hour(s))  MRSA PCR SCREENING     Status: None   Collection Time    03/07/2014  3:12 PM      Result Value Ref Range Status   MRSA by PCR NEGATIVE  NEGATIVE Final   Comment:            The GeneXpert MRSA Assay (FDA     approved for NASAL specimens     only), is one component of Lawrence     comprehensive MRSA colonization     surveillance program. It is not     intended to diagnose  MRSA     infection nor to guide or     monitor treatment for     MRSA infections.     Studies: Ct Head Wo Contrast  03/03/2014   CLINICAL DATA:  Altered mental status.  EXAM: CT HEAD WITHOUT CONTRAST  TECHNIQUE: Contiguous axial images were obtained from the base of the skull through the vertex without intravenous contrast.  COMPARISON:  02/27/2014  FINDINGS: Skull and Sinuses:No acute fracture destructive process.  Retained secretions within Lawrence posterior left ethmoid air cell.  Orbits: No acute abnormality.  Brain: New ovoid low-attenuation in the central left thalamus. This was not seen on previous CT or on diffusion imaging 02/27/2014. Lawrence remote hemorrhagic infarct in the pulvinar region of the left thalamus is visible more inferiorly and posteriorly. In the medial right putamen is Lawrence low-density rounded focus which may have been present previously, but appears larger or more discrete. Similar pattern of diffuse bilateral cerebral white matter low density, which harbors multiples small infarcts based on recent MRI. Bilateral brainstem infarcts appear similar to previous MRI. No evidence of hemorrhagic conversion, large vessel infarct, hydrocephalus, mass lesion, or shift.  IMPRESSION: Left thalamus lacunar infarction that has  occurred since 02/27/2014 MRI. The majority of punctate infarcts noted 02/27/2014 are not discretely seen by CT; infarct progression may be underestimated on this study.   Electronically Signed   By: Tiburcio PeaJonathan  Watts M.D.   On: 03/03/2014 06:41   Dg Chest Port 1 View  03/03/2014   CLINICAL DATA:  Fever.  Concern for pulmonary infection.  EXAM: PORTABLE CHEST - 1 VIEW  COMPARISON:  None.  FINDINGS: Normal mediastinum and cardiac silhouette. Normal pulmonary vasculature. No evidence of effusion, infiltrate, or pneumothorax. No acute bony abnormality.  IMPRESSION: No acute cardiopulmonary process.   Electronically Signed   By: Genevive BiStewart  Edmunds M.D.   On: 03/03/2014 09:24    Scheduled Meds: .  stroke: mapping our early stages of recovery book   Does not apply Once  . insulin aspart  0-9 Units Subcutaneous TID WC  . lisinopril  10 mg Oral Daily  . metoprolol tartrate  12.5 mg Oral BID  . senna-docusate  1 tablet Oral BID  . sodium chloride  1,000 mL Intravenous Once  . sodium chloride  3 mL Intravenous Q12H   Continuous Infusions: . sodium chloride 75 mL/hr at 03/03/14 0656       Time spent: 35 minutes    [facility] Health - CrowleyELMAHI,Arthur Lawrence  [facility] Pager [phone] If 7PM-7AM, please contact night-coverage at [email or web address], password [email or web address] 03/03/2014, 9:27 AM  LOS: 5 days

## 2014-03-03 NOTE — Progress Notes (Signed)
[facility] progress note. Chief complaint. Altered mental status. History of present illness. This 63 year old male in hospital with GI bleed also experienced acute CVA, hemorrhagic shock from acute blood loss thought secondary to diverticular bleed. Patient was noted this morning to have altered mental status with his eyes closed and nonverbal as well as not following commands. Code stroke was called and neurology came to see the patient at the bedside. Patient was sent down a CT scan for a stat CT of the brain. During that process the patient was noted to be febrile 101.1, mildly tachycardic with a pulse of 110 and blood pressure 106/62. He was noted to have a large bloody bowel movement. The patient was on route to transport to room 2S-16 for higher acuity care. I met the patient on arrival to further evaluate. I find him with eyes closed and nonresponsive, even to sternal rub. Vital signs. Temperature 101.1, pulse 110, respirations 18, blood pressure 106/62. O2 sats 100% on nasal cannula oxygen. General appearance. Well-developed male who is obtunded. Cardiac. Rate tachycardic and rhythm regular. Lungs. Diminished absent sounds in the bases bilaterally. Some rhonchi in the upper airway. Abdomen. Soft with positive bowel sounds. Impression/plan. Problem 1. Altered mental status. Patient status post CT of the brain and neurology is following for this. Unclear if this represents seizure or stroke. Given 1 mg of Ativan per neurology order. An EEG is pending. Problem #2. Fever unknown origin. I ordered a portable chest x-ray and urinalysis with urine culture as well as blood cultures.. He does demonstrate leukocytosis with current WBC 11.5. Will defer empiric antibiotics at this point as I can find no clear evidence of infection. Problem #3. GI bleed. A likely diverticular previously. Patient has had no bleeding for the last few days. Will type and screen and transfused one unit of packed red blood  cells as his morning hemoglobin is 6.5. Also ordered serial hemoglobins every 6 hours for 4 minutes to follow. It appears gastroenterology is already following.

## 2014-03-03 NOTE — Consult Note (Signed)
Patient ZO:XWRUEA:Arthur Lawrence      DOB: October 27, 1950      VWU:[id number]     Consult Note from the Palliative Medicine Team at [facility]    Consult Requested by:Dr Elmahi    PCP: [name],[name], MD Reason for Consultation:EOL symptom management     Phone Number:[phone]  Assessment/Recommendations: 63 yo male with CVA and GI bleed. Acute decline to minimal response state ongoing GI bleed and concern for hemorrhagic transformation of CVA. Goal of full comfort care.    1.  Code Status:DNR  2. GOC: Full comfort. I have placed SW consult to discuss residential hospice placement.  Patient minimally responsive with concerns for evolving CVA w/hemorrhagic transformation and ongoing GI bleed with focus on full comfort. Suspect prognosis to be in days.I informed family of likely evaluation for residential hospice. Recommend transfer to floor bed. No need for cardiac or O2 monitoring with goal of comfort.     3. Symptom Management:  - Agree with PRN morphine. Has increased secretions so will add scop patch and PRN atropine drops.  Ativan PRN anxiety.   4. Psychosocial/Spiritual: Wife Arthur Lawrence at bedside. They have 2 daughters who are aware of situation. Arthur Lawrence is well supported by family currently.Appreciate chaplain consult.     Brief HPI: 63 yo male with PMHx of DM, HTN, HLD who presented on 10/12 when he noted BRB upon getting up from toilet with associated weakness. This was followed by abnormal speech and gait disturbance. He underwent EGD and colonoscopy which revealed large amounts of blood but no active bleeding.  Further imaging of head revealed multiple infarcts.  He was transferred to MICU for lethargy. TEE did not reveal embolic source for stroke.  In past 24h, rapid response called with fevers and worsening mentation.  He had further recurrence of GIB and after primary team discussion with wife, decision was made for comfort care. Palliative care was consulted to assist with end of life  management.    Family at bedside. They have not been able to arouse him.  They have heard medical situation from Hospitalist. Want focus to be purely on comfort.   ROS: Unable to obtain 2/2 severe encephalopathy and minimal responsive state.     PMH:  Past Medical History  Diagnosis Date  . Hypertension   . Allergic rhinitis   . Hyperlipidemia due to type 1 diabetes mellitus   . Type II diabetes mellitus   . History of blood transfusion 1976; 03/17/2014    "work accident; LGIB"  . Hepatitis C   . Acute lower GI bleeding 03/03/2014    "stemming from diverticulosis"     PSH: Past Surgical History  Procedure Laterality Date  . Laparoscopic cholecystectomy  2013  . Esophagogastroduodenoscopy N/A 03/02/2014    Procedure: ESOPHAGOGASTRODUODENOSCOPY (EGD);  Surgeon: Florencia Reasonsobert Buccini V, MD;  [facility];  Service: Endoscopy;  Laterality: N/A;  . Colonoscopy N/A 03/13/2014    Procedure: COLONOSCOPY;  Surgeon: Florencia Reasonsobert Buccini V, MD;  [name];  Service: Endoscopy;  Laterality: N/A;  . Tee without cardioversion N/A 02/15/2014    Procedure: TRANSESOPHAGEAL ECHOCARDIOGRAM (TEE);  Surgeon: Lewayne BuntingBrian S Crenshaw, MD;  [facility];  Service: Cardiovascular;  Laterality: N/A;   I have reviewed the FH and SH and  If appropriate update it with new information. No Known Allergies Scheduled Meds: .  stroke: mapping our early stages of recovery book   Does not apply Once  . insulin aspart  0-9 Units Subcutaneous TID WC  .  sodium chloride  1,000 mL Intravenous Once  . sodium chloride  3 mL Intravenous Q12H   Continuous Infusions:  PRN Meds:.hydrALAZINE, morphine injection, ondansetron (ZOFRAN) IV, ondansetron    BP 122/67  Pulse 116  Temp(Src) 100.1 F (37.8 C) (Axillary)  Resp 23  Ht 5\' 9"  (1.753 m)  Wt 68.7 kg (151 lb 7.3 oz)  BMI 22.36 kg/m2  SpO2 100%   PPS: 10   Intake/Output Summary (Last 24 hours) at 03/03/14 1105 Last data filed at 03/03/14  [id number]  Gross per 24 hour  Intake    105 ml  Output   2370 ml  Net  -2265 ml   Physical Exam:  General: Appears comfortable and in NAD HEENT:  Increased secretions Skin: warm/dry, pallor  Labs: CBC    Component Value Date/Time   WBC 11.5* 03/03/2014 0503   RBC 2.03* 03/03/2014 0503   HGB 6.5* 03/03/2014 0503   HCT 18.4* 03/03/2014 0503   PLT 187 03/03/2014 0503   MCV 90.6 03/03/2014 0503   MCH 32.0 03/03/2014 0503   MCHC 35.3 03/03/2014 0503   RDW 13.8 03/03/2014 0503   LYMPHSABS 2.5 10-12-2013 0934   MONOABS 0.4 10-12-2013 0934   EOSABS 0.1 10-12-2013 0934   BASOSABS 0.0 10-12-2013 0934    BMET    Component Value Date/Time   NA 139 03/03/2014 0503   K 4.3 03/03/2014 0503   CL 106 03/03/2014 0503   CO2 21 03/03/2014 0503   GLUCOSE 242* 03/03/2014 0503   BUN 19 03/03/2014 0503   CREATININE 0.98 03/03/2014 0503   CALCIUM 7.7* 03/03/2014 0503   GFRNONAA 86* 03/03/2014 0503   GFRAA >90 03/03/2014 0503    CMP     Component Value Date/Time   NA 139 03/03/2014 0503   K 4.3 03/03/2014 0503   CL 106 03/03/2014 0503   CO2 21 03/03/2014 0503   GLUCOSE 242* 03/03/2014 0503   BUN 19 03/03/2014 0503   CREATININE 0.98 03/03/2014 0503   CALCIUM 7.7* 03/03/2014 0503   PROT 4.3* 03/03/2014 0503   ALBUMIN 2.0* 03/03/2014 0503   AST 28 03/03/2014 0503   ALT 37 03/03/2014 0503   ALKPHOS 81 03/03/2014 0503   BILITOT 0.6 03/03/2014 0503   GFRNONAA 86* 03/03/2014 0503   GFRAA >90 03/03/2014 0503   10/13 MRI/MRA Head IMPRESSION:  MRI HEAD:  Exam is motion degraded.  Numerous punctate and patchy supratentorial infarcts bilaterally.  Acute small nonhemorrhagic infarct inferior left pons. Findings  raise possibility of embolic disease.  Remote partially hemorrhagic left thalamic and right pontine  infarct. Remote left pontine infarct.  Moderate small vessel disease type changes.  MRA HEAD:  Secondary to motion degradation, it is difficult to evaluate for  aneurysm or  adequately grade stenosis.  Flow is noted within medium and large size intracranial vessels.  Limited evaluation of branch vessels particularly right posterior  inferior cerebellar artery.  10/17 CT Head IMPRESSION:  Left thalamus lacunar infarction that has occurred since 02/27/2014  MRI. The majority of punctate infarcts noted 02/27/2014 are not  discretely seen by CT; infarct progression may be underestimated on  this study.   10/17 CXR IMPRESSION:  No acute cardiopulmonary process.   10/15 TEE Impressions:  - Normal LV function; mild MR; negative saline microcavitation study.  Total Time: 50 minutes  Greater than 50%  of this time was spent counseling and coordinating care related to the above assessment and plan.   Orvis BrillAaron J. Deandrae Wajda D.O. Palliative Medicine Team at  [place]  Pager: [phone] Team Phone: [phone]

## 2014-03-03 NOTE — Progress Notes (Signed)
Notified by RR nurse that patient had an acute change in mental status. Upon arrival patient noted to have eyes open, right gaze preference but eyes would rove to midline and slightly left, noted frequent rhythmic blinking of his eyes. No abnormal movements of his extremities.  Gave 1mg  ativan with resolution of symptoms. Will load with keppra 1000mg . Ordered stat head CT and order for EEG placed.   Would localize and withdrawal to noxious stimuli but no verbal output. Noted to have bright red blood per rectum. CBC and BMP pending.

## 2014-03-04 DIAGNOSIS — R0689 Other abnormalities of breathing: Secondary | ICD-10-CM

## 2014-03-04 MED ORDER — ACETAMINOPHEN 650 MG RE SUPP
650.0000 mg | RECTAL | Status: DC | PRN
  Administered 2014-03-04 (×2): 650 mg via RECTAL
  Filled 2014-03-04 (×2): qty 1

## 2014-03-04 MED ORDER — MORPHINE SULFATE 2 MG/ML IJ SOLN
2.0000 mg | Freq: Four times a day (QID) | INTRAMUSCULAR | Status: DC
  Administered 2014-03-04 – 2014-03-05 (×5): 2 mg via INTRAVENOUS
  Filled 2014-03-04 (×7): qty 1

## 2014-03-04 MED ORDER — KETOROLAC TROMETHAMINE 15 MG/ML IJ SOLN: 10.0000 mg | Freq: Four times a day (QID) | INTRAMUSCULAR | Status: DC | PRN

## 2014-03-04 MED ORDER — ACETAMINOPHEN 325 MG PO TABS: 650.0000 mg | ORAL_TABLET | Freq: Four times a day (QID) | ORAL | Status: DC | PRN

## 2014-03-04 MED ORDER — SODIUM CHLORIDE 0.9 % IV SOLN
12.5000 mg | Freq: Four times a day (QID) | INTRAVENOUS | Status: DC | PRN
  Administered 2014-03-04: 12.5 mg via INTRAVENOUS
  Filled 2014-03-04 (×4): qty 0.5

## 2014-03-04 NOTE — Progress Notes (Signed)
[facility] PROGRESS NOTE   Arthur Lawrence:[id number] DOB: 1950/11/07 DOA: 02/16/2014 PCP: Maryelizabeth Rowan, MD  HPI/Subjective:  Seen with his wife Arthur Lawrence) and her sister at bedside. Questions answered. Appears comfortable. Continues to have high fevers up to 102.3.  Brief history: [name] is 63 years old African American male with past medical history of hypertension and diabetes mellitus. Patient presented the hospital with rectal bleeding and difficulty with gait/confusion. Patient admitted and gastroenterology was consulted, EGD and colonoscopy was done and showed bleeding is probably secondary to diverticulosis. While patient in the hospital he developed lethargy and transferred to step down. MRI was done and showed brainstem stroke. Stroke workup showed no source of embolic stroke (including TEE). Restarted on aspirin on 10/16. On 10/17 morning patient developed lethargy, bleeding and unresponsiveness, transferred to the ICU with hemoglobin of 6.5. After prolonged discussion with the wife at bedside, patient care transitioned to full comfort.  Assessment/Plan: Active Problems:   GI bleeding   DM (diabetes mellitus)   HTN (hypertension)   Cerebral embolism with cerebral infarction   Altered mental status   Brain stem infarction    SIRS -Temperature of 101.1, heart rate of 115 and hypotension of 98/59. -Likely secondary to hemorrhagic shock from recurrent bleeding. -Currently is unresponsive even to painful stimuli.  GI bleed -Patient presents to the hospital with bright red blood per rectum, confusion and difficulty with gait. -EGD/colonoscopy done and showed findings consistent with probable diverticular bleed. -Currently no bleeding for the past 2 days. -Diet advanced to soft diet today, follow closely.  Acute CVA -As mentioned above admitted to the hospital as code stroke along with a GI bleeding. -MRI showed multiple supratentorial patches and small acute  pontine infarct. -Per neurology not watershed area so hypotension/hemorrhagic shock does not explain multiple infarcts. -Patient had a TEE which been negative, not candidate for anticoagulation as he has recent bleeding. -Restarted on aspirin by neurology after discussed with gastroenterology.  ABLA -Acute blood loss anemia secondary to gastrointestinal bleeding. -Patient had total transfusion of 4 units of packed RBCs so far. -Patient presents with hemorrhagic shock with blood pressure of 76/43.  Hemorrhagic shock -Secondary to acute blood loss from gastrointestinal bleeding. -As mentioned above patient presented with blood pressure of 76/43, lactate of 4.5 and bicarbonate of 13. -Elevated liver enzymes also consistent with shocked liver and hypotension.  Acute renal failure -Hemoglobin of 1.86 on admission, with metabolic acidosis with bicarbonate of 13. -Likely secondary to hypotension augmented by lisinopril. This is resolved after hydration with IV fluids.  Hypertension -Patient presented initially with hypotension likely secondary to hemorrhagic shock.  -Old hypertensive discontinued, blood pressure is increasing, I restarted lisinopril at lower dose.  Acute metabolic encephalopathy -Secondary to acute CVA and hypertension, this is resolved.  Hepatitis C -Quantitative PCR sent  Code Status: Full code Family Communication: Plan discussed with the patient. Disposition Plan: Remains inpatient   Consultants:  None  Procedures:  None  Antibiotics:  None   Objective: Filed Vitals:   03/04/14 0553  BP: 126/61  Pulse: 112  Temp: 102.3 F (39.1 C)  Resp: 24   No intake or output data in the 24 hours ending 03/04/14 1256 Filed Weights   02/27/14 0245 02/27/14 0400 02/27/14 0900  Weight: 66.452 kg (146 lb 8 oz) 66 kg (145 lb 8.1 oz) 68.7 kg (151 lb 7.3 oz)    Exam: General: Alert and awake, oriented x3, not in any acute distress. HEENT: anicteric sclera,  pupils reactive to  light and accommodation, EOMI CVS: S1-S2 clear, no murmur rubs or gallops Chest: clear to auscultation bilaterally, no wheezing, rales or rhonchi Abdomen: soft nontender, nondistended, normal bowel sounds, no organomegaly Extremities: no cyanosis, clubbing or edema noted bilaterally Neuro: Cranial nerves II-XII intact, no focal neurological deficits  Data Reviewed: Basic Metabolic Panel:  Recent Labs Lab 02/27/14 0224 02/28/14 0245 05-11-2014 0130 03/02/14 0148 03/03/14 0503  NA 142 151* 144 143 139  K 4.9 3.5* 3.2* 3.7 4.3  CL 112 117* 106 107 106  CO2 13* 23 29 26 21   GLUCOSE 316* 103* 94 88 242*  BUN 39* 35* 15 11 19   CREATININE 1.86* 1.25 0.91 0.83 0.98  CALCIUM 7.0* 7.3* 7.4* 7.9* 7.7*  MG  --   --  2.1 2.2  --   PHOS  --   --  2.0* 3.0  --    Liver Function Tests:  Recent Labs Lab 03/04/2014 0934 02/27/14 0224 03/03/14 0503  AST 59* 39* 28  ALT 66* 44 37  ALKPHOS 70 42 81  BILITOT 0.6 1.0 0.6  PROT 6.0 4.3* 4.3*  ALBUMIN 3.0* 2.2* 2.0*   No results found for this basename: LIPASE, AMYLASE,  in the last 168 hours  Recent Labs Lab 02/27/14 0224  AMMONIA 68*   CBC:  Recent Labs Lab 03/04/2014 0934  02/27/14 1007  02/28/14 0245  05-11-2014 0130 05-11-2014 0850 05-11-2014 1927 03/02/14 0148 03/03/14 0503  WBC 13.2*  < > 18.1*  --  17.0*  --  10.3  --   --  8.0 11.5*  NEUTROABS 10.2*  --   --   --   --   --   --   --   --   --   --   HGB 10.5*  < > 10.1*  < > 8.3*  < > 7.7*  7.8* 8.3* 8.5* 8.1* 6.5*  HCT 30.6*  < > 28.0*  < > 23.7*  < > 22.0*  22.1* 23.9* 24.8* 23.4* 18.4*  MCV 89.5  < > 85.1  --  85.6  --  87.4  --   --  91.4 90.6  PLT 236  < > 99*  --  99*  --  98*  --   --  114* 187  < > = values in this interval not displayed. Cardiac Enzymes: No results found for this basename: CKTOTAL, CKMB, CKMBINDEX, TROPONINI,  in the last 168 hours BNP (last 3 results) No results found for this basename: PROBNP,  in the last 8760  hours CBG:  Recent Labs Lab 03/02/14 0816 03/02/14 1145 03/02/14 1608 03/02/14 1958 03/03/14 0004  GLUCAP 119* 186* 171* 157* 197*    Micro Recent Results (from the past 240 hour(s))  MRSA PCR SCREENING     Status: None   Collection Time    03/08/2014  3:12 PM      Result Value Ref Range Status   MRSA by PCR NEGATIVE  NEGATIVE Final   Comment:            The GeneXpert MRSA Assay (FDA     approved for NASAL specimens     only), is one component of a     comprehensive MRSA colonization     surveillance program. It is not     intended to diagnose MRSA     infection nor to guide or     monitor treatment for     MRSA infections.     Studies: Ct Head Wo  Contrast  03/03/2014   CLINICAL DATA:  Altered mental status.  EXAM: CT HEAD WITHOUT CONTRAST  TECHNIQUE: Contiguous axial images were obtained from the base of the skull through the vertex without intravenous contrast.  COMPARISON:  02/27/2014  FINDINGS: Skull and Sinuses:No acute fracture destructive process.  Retained secretions within a posterior left ethmoid air cell.  Orbits: No acute abnormality.  Brain: New ovoid low-attenuation in the central left thalamus. This was not seen on previous CT or on diffusion imaging 02/27/2014. A remote hemorrhagic infarct in the pulvinar region of the left thalamus is visible more inferiorly and posteriorly. In the medial right putamen is a low-density rounded focus which may have been present previously, but appears larger or more discrete. Similar pattern of diffuse bilateral cerebral white matter low density, which harbors multiples small infarcts based on recent MRI. Bilateral brainstem infarcts appear similar to previous MRI. No evidence of hemorrhagic conversion, large vessel infarct, hydrocephalus, mass lesion, or shift.  IMPRESSION: Left thalamus lacunar infarction that has occurred since 02/27/2014 MRI. The majority of punctate infarcts noted 02/27/2014 are not discretely seen by CT; infarct  progression may be underestimated on this study.   Electronically Signed   By: Tiburcio PeaJonathan  Watts M.D.   On: 03/03/2014 06:41   Dg Chest Port 1 View  03/03/2014   CLINICAL DATA:  Fever.  Concern for pulmonary infection.  EXAM: PORTABLE CHEST - 1 VIEW  COMPARISON:  None.  FINDINGS: Normal mediastinum and cardiac silhouette. Normal pulmonary vasculature. No evidence of effusion, infiltrate, or pneumothorax. No acute bony abnormality.  IMPRESSION: No acute cardiopulmonary process.   Electronically Signed   By: Genevive BiStewart  Edmunds M.D.   On: 03/03/2014 09:24    Scheduled Meds: .  morphine injection  2 mg Intravenous Q6H  . scopolamine  1 patch Transdermal Q72H   Continuous Infusions:       Time spent: 35 minutes    [facility] LtdELMAHI,Ileene Allie A  [facility] Pager [phone] If 7PM-7AM, please contact night-coverage at [email or web address], password [email or web address] 03/04/2014, 12:56 PM  LOS: 6 days

## 2014-03-04 NOTE — Progress Notes (Addendum)
Patient Arthur Lawrence:Bassel B Kwasnik      DOB: February 20, 1951      VWU:[id number]   Palliative Medicine Team at [facility] Progress Note    Subjective: Appears to be resting comfortably. Not able to have any purposeful interactions. Family notes secretions better. Had some hiccups which morphine seems to help.     Filed Vitals:   03/04/14 0553  BP: 126/61  Pulse: 112  Temp: 102.3 F (39.1 C)  Resp: 24   Physical exam: GEN: no purposeful interactions, NAD HEENT: [place], mmm CV: tachy LUNGS: CTAB Ext: warm    Assessment and plan: 63 yo male with CVA and GI bleed. Acute decline to minimal response state ongoing GI bleed and concern for hemorrhagic transformation of CVA. Goal of full comfort care.   1. Code Status:DNR   2. GOC: Full comfort. I have placed SW consult to discuss residential hospice placement. Patient minimally responsive with concerns for evolving CVA w/hemorrhagic transformation and ongoing GI bleed with focus on full comfort. Suspect prognosis to be in days.I informed family of likely evaluation for residential hospice. Recommend transfer to floor bed. No need for cardiac or O2 monitoring with goal of comfort.    3. Symptom Management:  - morphine PRN, scop patch, atropine drops. - I will schedule morphine q6 today as he received 3 doses in apst 24h and family found this to help with comfort/breathing  4. Psychosocial/Spiritual: Wife [name] at bedside. They have 2 daughters who are aware of situation. [name] is well supported by family currently.Appreciate chaplain consult.    Orvis BrillAaron J. Marijayne Rauth D.O. Palliative Medicine Team at [facility]  Pager: [phone] Team Phone: [phone]  Hiccups today. Will order PRN thorazine as unable to swallow tablets Orvis BrillAaron J. Nova Evett D.O. Palliative Medicine Team at [facility]  Pager: [phone] Team Phone: [phone]

## 2014-03-05 DIAGNOSIS — R578 Other shock: Secondary | ICD-10-CM | POA: Diagnosis present

## 2014-03-05 DIAGNOSIS — R066 Hiccough: Secondary | ICD-10-CM

## 2014-03-05 LAB — HCV RNA QUANT
HCV QUANT LOG: 6.26 {Log} — AB (ref <1.18)
HCV QUANT: 1823657 [IU]/mL — AB (ref <15)

## 2014-03-05 MED ORDER — HALOPERIDOL LACTATE 5 MG/ML IJ SOLN: 2.0000 mg | Freq: Four times a day (QID) | INTRAMUSCULAR | Status: DC | PRN

## 2014-03-05 MED ORDER — SODIUM CHLORIDE 0.9 % IV SOLN
12.5000 mg | Freq: Three times a day (TID) | INTRAVENOUS | Status: DC
  Administered 2014-03-05 (×2): 12.5 mg via INTRAVENOUS
  Filled 2014-03-05 (×3): qty 0.5

## 2014-03-06 LAB — TYPE AND SCREEN
ABO/RH(D): AB POS
Antibody Screen: NEGATIVE
Unit division: 0
Unit division: 0

## 2014-03-08 LAB — ANTIPHOSPHOLIPID SYNDROME EVAL, BLD
ANTICARDIOLIPIN IGM: 0 [MPL'U]/mL — AB (ref <11)
Anticardiolipin IgA: 8 APL U/mL — ABNORMAL LOW (ref <22)
Anticardiolipin IgG: 3 GPL U/mL — ABNORMAL LOW (ref <23)
DRVVT: 34.6 secs (ref <42.9)
Lupus Anticoagulant: NOT DETECTED
PHOSPHATYDALSERINE, IGG: 4 U/mL (ref <16)
PTT LA: 26.4 s — AB (ref 28.0–43.0)
Phosphatydalserine, IgA: 9 U/mL (ref <20)
Phosphatydalserine, IgM: 4 U/mL (ref <22)

## 2014-03-09 LAB — CULTURE, BLOOD (ROUTINE X 2)
Culture: NO GROWTH
Culture: NO GROWTH

## 2014-03-18 NOTE — Progress Notes (Signed)
Pt's wife called front desk for nurse to come in.  Upon entering pt's room, pt was not breathing, no pulse, and without heartbeat.  Verified death with Purcell NailsJoan King, RN.  Attending physician notified and bed control notified.  [place] [facility] called, spoke with Micheal Likensliver Harper and received referral number [phone](02/25/2014-062).  Pt is not a candidate for donation.   Vanice Sarahhompson, [place]ate[place] Varon L

## 2014-03-18 NOTE — Progress Notes (Signed)
Rehab admissions - I have reviewed pt's case and noted rehab MD consult completed on 03-02-14. I noted palliative care's note from today. Per palliative care, pt has had a significant decline since rehab consult completed and inpatient rehab is not appropriate at this time. I will now sign off pt's case. I spoke with pt's RN about pt status.  Thank you.  Juliann MuleJanine Adante Courington, PT Rehabilitation Admissions Coordinator [phone];

## 2014-03-18 NOTE — Plan of Care (Signed)
Problem: Phase I Progression Outcomes Goal: Hemodynamically stable Outcome: Not Met (add Reason) Pt is under palliative care     Problem: Phase II Progression Outcomes Goal: Hemodynamically stable Outcome: Not Met (add Reason) Pt is comfort care    Goal: H&H stablized < 1gm drop in 24 hrs Outcome: Not Met (add Reason) Pt comfort care    Goal: Progress activity as tolerated unless otherwise ordered Outcome: Not Progressing Pt comfort care    Goal: Tolerating diet Outcome: Not Progressing Pt comfort care  Problem: Phase III Progression Outcomes Goal: Activity at appropriate level-compared to baseline (UP IN CHAIR FOR HEMODIALYSIS)  Outcome: Not Met (add Reason) Pt comfort care    Goal: H&H stablized <1gm drop in 24 hrs, no active bleeding Outcome: Not Met (add Reason) Pt full comfort care

## 2014-03-18 NOTE — Progress Notes (Signed)
Patient WU:[id number]:Arthur Lawrence      DOB: [birth year]      YNW:[id number]   Palliative Medicine Team at [facility] Progress Note    Subjective: Nonresponsive. NAD.  Wife noted hiccups overnight and that Thorazine greatly helped.     Filed Vitals:   2013/12/13 0612  BP: 123/61  Pulse: 103  Temp: 98.7 F (37.1 C)  Resp: 20   Physical exam: GEN: no purposeful interactions, NAD  HEENT: [place], mmm  CV: tachy  LUNGS: CTAB  Ext: warm   Assessment and plan: 63 yo male with CVA and GI bleed. Acute decline to minimal response state ongoing GI bleed and concern for hemorrhagic transformation of CVA. Goal of full comfort care.  1. Code Status:DNR   2. GOC: Full comfort. See previous notes.  Tried to explore GIP admission but not eligible based on insurance.  I think at this time he is stable for transfer but he is high risk for decline in next hours.  If not able to transfer to [facility] this afternoon, will need to re-assess his safety for transfer. It's possible he could be unstable for transfer even by this afternoon.  If showing further signs of decline please contact me and I would be happy to way in.  His prognosis still could be in days, but his GIB issues put him at risk for rapid transition.   3. Symptom Management:  -  scop patch, atropine drops.  - continue scheduled morphine and PRN.  - Will add scheduled thorazine as hiccups continue to be problem and he seemed to respond well to dose last night. Hopefully this helps prevent their onset.   4. Psychosocial/Spiritual: Wife Arthur Lawrence at bedside. They have 2 daughters who are aware of situation. Arthur Lawrence is well supported by family currently.Appreciate chaplain consult.   Total Time: 30 minutes  >50% of time spent in counseling and coordination of care regarding above assessment and plan  Orvis BrillAaron J. Shakeeta Godette D.O. Palliative Medicine Team at [facility]  Pager: [phone] Team Phone: [phone]

## 2014-03-18 NOTE — Clinical Social Work Note (Signed)
[facility] has availability for patient tomorrow. No other bed offers at this time.  CSW will continue to follow.  Merlyn LotJenna Holoman, LCSWA Clinical Social Worker [phone]

## 2014-03-18 NOTE — Discharge Summary (Signed)
Death Summary  Arthur HummerRonald B Lawrence ONG:[id number] DOB: December 05, 1950 DOA: 02/27/2014  PCP: [name],[name], MD  Admit date: 02/18/2014 Date of Death: Jan 17, 2014  Final Diagnoses:  Principal Problem:   Hemorrhagic shock Active Problems:   GI bleeding   DM (diabetes mellitus)   HTN (hypertension)   Cerebral embolism with cerebral infarction   Altered mental status   Brain stem infarction     History of present illness:  Arthur Lawrence is a 63 y.o. male  Who was initially brought in as a code stroke. Patient states he got up to use the bathroom and noticed BRB in the toilet. When he got up from the toilet, he felt weak all over. He did not fall. Family reports that patient was leaning toward the left and speech was nonsensical. In the ER, patient had several more bouts of bloody stools and a near-syncopal event when he got up to the bedside commode.  Patient has been in his normal state of health up to this AM. He works out daily and is retired. He is on daily ASA.  Last colonoscopy was about 2007 per patient and he had 1 polyp removed.   Hospital Course:  [name] is 63 years old African American male with past medical history of hypertension and diabetes mellitus. Patient presented the hospital with rectal bleeding and difficulty with gait/confusion. Patient admitted and gastroenterology was consulted, EGD and colonoscopy was done and showed bleeding is probably secondary to diverticulosis. While patient in the hospital he developed lethargy and transferred to step down. MRI was done and showed brainstem stroke. Stroke workup showed no source of embolic stroke (including TEE). Patient improved for one day, transferred out of stepdown/ICU regular floor and he was awake, alert and oriented x3. Restarted on aspirin on 10/16. On 10/17 morning patient developed lethargy, bleeding and unresponsiveness, transferred to the ICU with hemoglobin of 6.5. After prolonged discussion with the wife at bedside,  patient care transitioned to full comfort. Symptoms of pain and agitation controlled with as needed morphine pushed, patient expired on Jan 17, 2014 at 1620 5 PM family at bedside   Date of death: Jan 17, 2014. Time of death: 1625  Signed:  Alixandrea Milleson A  [facility] Jan 17, 2014, 6:21 PM

## 2014-03-18 NOTE — Clinical Social Work Note (Signed)
Dr. Charline BillsStated that patient is likely GIP eligible.  Per RN case manager patients insurance will not allow patient to be GIP- residential hospice is now recommended.  RN case manager stated that patients wife is agreeable to [facility].  [facility].  CSW will continue to follow.  Merlyn LotJenna Holoman, LCSWA Clinical Social Worker [phone]

## 2014-03-18 NOTE — Progress Notes (Signed)
Nutrition Brief Note  Chart reviewed. Pt now transitioning to comfort care.  No further nutrition interventions warranted at this time.  Please re-consult as needed.   Keyon Winnick La, MS, RD, LDN Pager # [phone] After hours/ weekend pager # [phone]   

## 2014-03-18 NOTE — Progress Notes (Signed)
[facility] PROGRESS NOTE   EUELL SCHIFF ZOX:[id number] DOB: 1951/05/03 DOA: 03/08/2014 PCP: Maryelizabeth Rowan, MD  HPI/Subjective:  Seen with his wife Britta Mccreedy) and her sister at bedside. Appears comfortable.  [facility] liaison see the patient, has bed available in AM.  Brief history: Mr. Mcgibbon is 63 years old African American male with past medical history of hypertension and diabetes mellitus. Patient presented the hospital with rectal bleeding and difficulty with gait/confusion. Patient admitted and gastroenterology was consulted, EGD and colonoscopy was done and showed bleeding is probably secondary to diverticulosis. While patient in the hospital he developed lethargy and transferred to step down. MRI was done and showed brainstem stroke. Stroke workup showed no source of embolic stroke (including TEE). Restarted on aspirin on 10/16. On 10/17 morning patient developed lethargy, bleeding and unresponsiveness, transferred to the ICU with hemoglobin of 6.5. After prolonged discussion with the wife at bedside, patient care transitioned to full comfort.  Assessment/Plan: Active Problems:   GI bleeding   DM (diabetes mellitus)   HTN (hypertension)   Cerebral embolism with cerebral infarction   Altered mental status   Brain stem infarction    SIRS -Temperature of 101.1, heart rate of 115 and hypotension of 98/59. -Likely secondary to hemorrhagic shock from recurrent bleeding. -Currently is unresponsive even to painful stimuli.  GI bleed -Patient presents to the hospital with bright red blood per rectum, confusion and difficulty with gait. -EGD/colonoscopy done and showed findings consistent with probable diverticular bleed. -Currently no bleeding for the past 2 days. -Diet advanced to soft diet today, follow closely.  Acute CVA -As mentioned above admitted to the hospital as code stroke along with a GI bleeding. -MRI showed multiple supratentorial patches and small  acute pontine infarct. -Per neurology not watershed area so hypotension/hemorrhagic shock does not explain multiple infarcts. -Patient had a TEE which been negative, not candidate for anticoagulation as he has recent bleeding. -Restarted on aspirin by neurology after discussed with gastroenterology.  ABLA -Acute blood loss anemia secondary to gastrointestinal bleeding. -Patient had total transfusion of 4 units of packed RBCs so far. -Patient presents with hemorrhagic shock with blood pressure of 76/43.  Hemorrhagic shock -Secondary to acute blood loss from gastrointestinal bleeding. -As mentioned above patient presented with blood pressure of 76/43, lactate of 4.5 and bicarbonate of 13. -Elevated liver enzymes also consistent with shocked liver and hypotension.  Acute renal failure -Hemoglobin of 1.86 on admission, with metabolic acidosis with bicarbonate of 13. -Likely secondary to hypotension augmented by lisinopril. This is resolved after hydration with IV fluids.  Hypertension -Patient presented initially with hypotension likely secondary to hemorrhagic shock.  -Old hypertensive discontinued, blood pressure is increasing, I restarted lisinopril at lower dose.  Acute metabolic encephalopathy -Secondary to acute CVA and hypertension, this is resolved.  Hepatitis C -Quantitative PCR sent  Code Status: Full code Family Communication: Plan discussed with the patient. Disposition Plan: Remains inpatient   Consultants:  None  Procedures:  None  Antibiotics:  None   Objective: Filed Vitals:   18-Mar-2014 0612  BP: 123/61  Pulse: 103  Temp: 98.7 F (37.1 C)  Resp: 20    Intake/Output Summary (Last 24 hours) at 03-18-14 1540 Last data filed at Mar 18, 2014 1418  Gross per 24 hour  Intake     25 ml  Output    150 ml  Net   -125 ml   Filed Weights   02/27/14 0245 02/27/14 0400 02/27/14 0900  Weight: 66.452 kg (146 lb 8 oz) 66  kg (145 lb 8.1 oz) 68.7 kg (151 lb 7.3  oz)    Exam: General: Alert and awake, oriented x3, not in any acute distress. HEENT: anicteric sclera, pupils reactive to light and accommodation, EOMI CVS: S1-S2 clear, no murmur rubs or gallops Chest: clear to auscultation bilaterally, no wheezing, rales or rhonchi Abdomen: soft nontender, nondistended, normal bowel sounds, no organomegaly Extremities: no cyanosis, clubbing or edema noted bilaterally Neuro: Cranial nerves II-XII intact, no focal neurological deficits  Data Reviewed: Basic Metabolic Panel:  Recent Labs Lab 02/27/14 0224 02/28/14 0245 03/08/2014 0130 03/02/14 0148 03/03/14 0503  NA 142 151* 144 143 139  K 4.9 3.5* 3.2* 3.7 4.3  CL 112 117* 106 107 106  CO2 13* 23 29 26 21   GLUCOSE 316* 103* 94 88 242*  BUN 39* 35* 15 11 19   CREATININE 1.86* 1.25 0.91 0.83 0.98  CALCIUM 7.0* 7.3* 7.4* 7.9* 7.7*  MG  --   --  2.1 2.2  --   PHOS  --   --  2.0* 3.0  --    Liver Function Tests:  Recent Labs Lab 02/27/14 0224 03/03/14 0503  AST 39* 28  ALT 44 37  ALKPHOS 42 81  BILITOT 1.0 0.6  PROT 4.3* 4.3*  ALBUMIN 2.2* 2.0*   No results found for this basename: LIPASE, AMYLASE,  in the last 168 hours  Recent Labs Lab 02/27/14 0224  AMMONIA 68*   CBC:  Recent Labs Lab 02/27/14 1007  02/28/14 0245  02/22/2014 0130 02/25/2014 0850 02/21/2014 1927 03/02/14 0148 03/03/14 0503  WBC 18.1*  --  17.0*  --  10.3  --   --  8.0 11.5*  HGB 10.1*  < > 8.3*  < > 7.7*  7.8* 8.3* 8.5* 8.1* 6.5*  HCT 28.0*  < > 23.7*  < > 22.0*  22.1* 23.9* 24.8* 23.4* 18.4*  MCV 85.1  --  85.6  --  87.4  --   --  91.4 90.6  PLT 99*  --  99*  --  98*  --   --  114* 187  < > = values in this interval not displayed. Cardiac Enzymes: No results found for this basename: CKTOTAL, CKMB, CKMBINDEX, TROPONINI,  in the last 168 hours BNP (last 3 results) No results found for this basename: PROBNP,  in the last 8760 hours CBG:  Recent Labs Lab 03/02/14 0816 03/02/14 1145 03/02/14 1608  03/02/14 1958 03/03/14 0004  GLUCAP 119* 186* 171* 157* 197*    Micro Recent Results (from the past 240 hour(s))  MRSA PCR SCREENING     Status: None   Collection Time    Jul 28, 2013  3:12 PM      Result Value Ref Range Status   MRSA by PCR NEGATIVE  NEGATIVE Final   Comment:            The GeneXpert MRSA Assay (FDA     approved for NASAL specimens     only), is one component of a     comprehensive MRSA colonization     surveillance program. It is not     intended to diagnose MRSA     infection nor to guide or     monitor treatment for     MRSA infections.  CULTURE, BLOOD (ROUTINE X 2)     Status: None   Collection Time    03/03/14  8:00 AM      Result Value Ref Range Status   Specimen Description BLOOD LEFT  HAND   Final   Special Requests BOTTLES DRAWN AEROBIC ONLY 5CC   Final   Culture  Setup Time     Final   Value: 03/03/2014 19:21     Performed at [facility]   Culture     Final   Value:        BLOOD CULTURE RECEIVED NO GROWTH TO DATE CULTURE WILL BE HELD FOR 5 DAYS BEFORE ISSUING A FINAL NEGATIVE REPORT     Performed at [facility]   Report Status PENDING   Incomplete  CULTURE, BLOOD (ROUTINE X 2)     Status: None   Collection Time    03/03/14  8:05 AM      Result Value Ref Range Status   Specimen Description BLOOD LEFT ARM   Final   Special Requests BOTTLES DRAWN AEROBIC ONLY 5CC   Final   Culture  Setup Time     Final   Value: 03/03/2014 19:21     Performed at [facility]   Culture     Final   Value:        BLOOD CULTURE RECEIVED NO GROWTH TO DATE CULTURE WILL BE HELD FOR 5 DAYS BEFORE ISSUING A FINAL NEGATIVE REPORT     Performed at [facility]   Report Status PENDING   Incomplete     Studies: No results found.  Scheduled Meds: . chlorproMAZINE (THORAZINE) IV  12.5 mg Intravenous TID  .  morphine injection  2 mg Intravenous Q6H  . scopolamine  1 patch Transdermal Q72H   Continuous Infusions:       Time spent: 35  minutes    [facility] CenterELMAHI,Tome Wilson A  [facility] Pager [phone] If 7PM-7AM, please contact night-coverage at [email or web address], password [email or web address] 10-May-2014, 3:40 PM  LOS: 7 days

## 2014-03-18 NOTE — Clinical Social Work Psychosocial (Signed)
Clinical Social Work Department BRIEF PSYCHOSOCIAL ASSESSMENT 02/22/2014  Patient:  Arthur Lawrence,Arthur Lawrence     Account Number:  [id number]     Admit date:  11/27/2013  Clinical Social Worker:  Arthur Lawrence,Arthur Lawrence, CLINICAL SOCIAL WORKER  Date/Time:  02/15/2014 10:00 AM  Referred by:  Physician  Date Referred:  02/15/2014 Referred for  Residential [facility] placement   Other Referral:   Interview type:  Family Other interview type:    PSYCHOSOCIAL DATA Living Status:  ALONE Admitted from facility:   Level of care:   Primary support name:  Arthur Lawrence Primary support relationship to patient:  CHILD, ADULT Degree of support available:   Patients daughter reported high level of support- patients daughter is very emotional supportive but is unable to give 24hour care due to her work schedule.    CURRENT CONCERNS Current Concerns  Post-Acute Placement   Other Concerns:    SOCIAL WORK ASSESSMENT / PLAN CSW spoke with patients daugher concerning doctors recommendation for [facility] placement.  Patients [place] [facility] [place].  CSW will continue to follow.   Assessment/plan status:  Psychosocial Support/Ongoing Assessment of Needs Other assessment/ plan:   Information/referral to community resources:   Arthur Lawrence    PATIENT'S/FAMILY'S RESPONSE TO PLAN OF CARE: Patients daughter is agreeable to [facility]ential [facility] but is very overhwhelmed by all responsibility of handling her fathers impending death.     Arthur LotJenna Lawrence, LCSWA Clinical Social Worker [phone]

## 2014-03-18 DEATH — deceased

## 2014-03-29 ENCOUNTER — Encounter: Payer: BC Managed Care – PPO | Admitting: Physician Assistant

## 2016-10-12 IMAGING — CT CT CHEST W/ CM
2 of 5 series · 14 of 36 positions shown, 17 images · IV contrast (Omni 300)
Comparison: None.

CLINICAL DATA: Initial evaluation for bloody stools last night,
dizziness, left-sided weakness, confusion, MRI showed acute infarct
in the pons, concern for possible malignancy causing
gastrointestinal hemorrhage

EXAM:
CT CHEST, ABDOMEN, AND PELVIS WITH CONTRAST
TECHNIQUE: Multidetector CT imaging of the chest, abdomen and pelvis was
performed following the standard protocol during bolus
administration of intravenous contrast.
CONTRAST:  100mL OMNIPAQUE IOHEXOL 300 MG/ML  SOLN

[Series 2: cap 5.0 i31f 1 · axial · 0.73mm/px · z∈[-1006,-431]mm · 11 of 131 slices shown, 14 images]
[im 8/131  mediastinal]
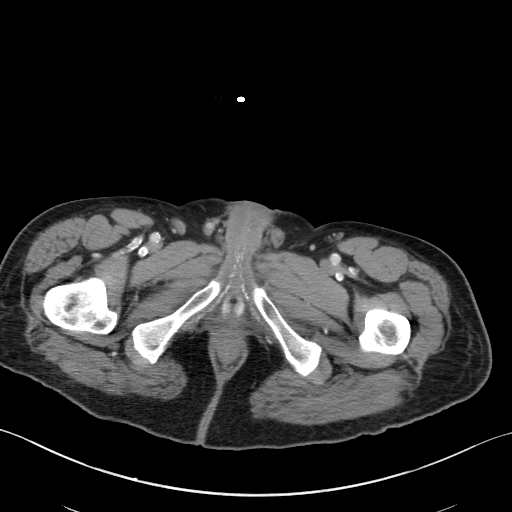
[im 8/131  lung]
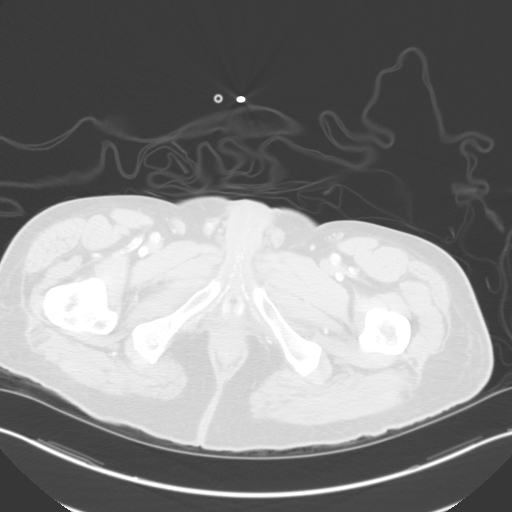
[im 23/131  lung]
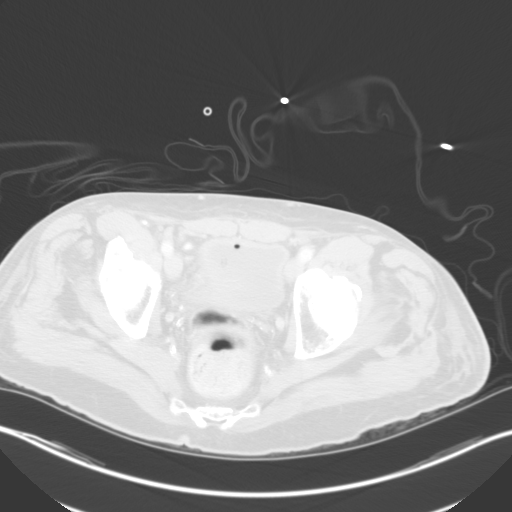
[im 31/131  lung]
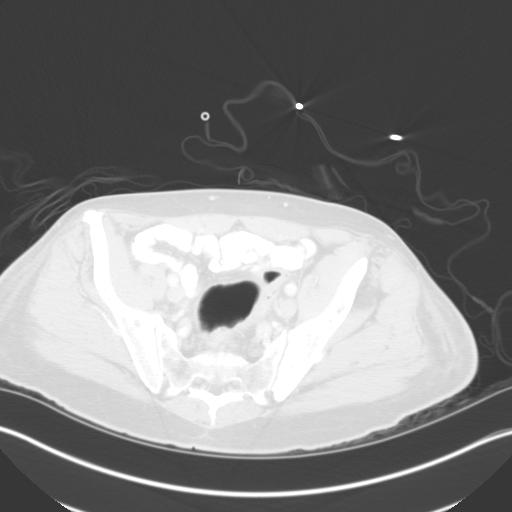
[im 46/131  lung]
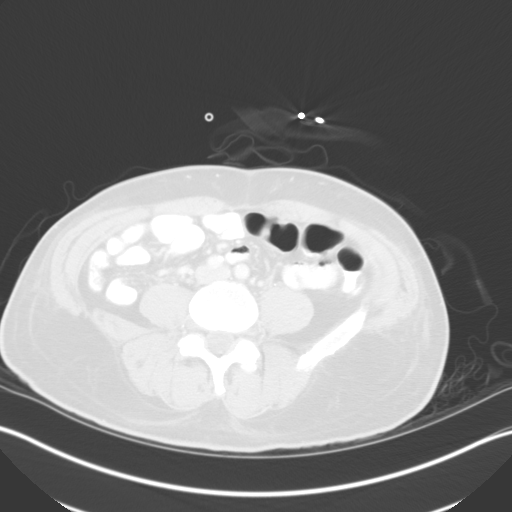
[im 54/131  mediastinal]
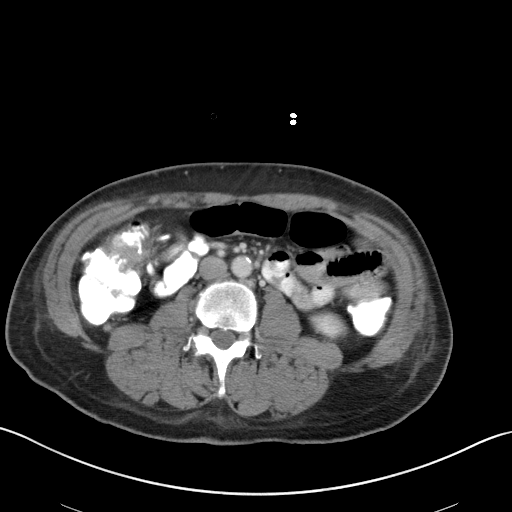
[im 54/131  lung]
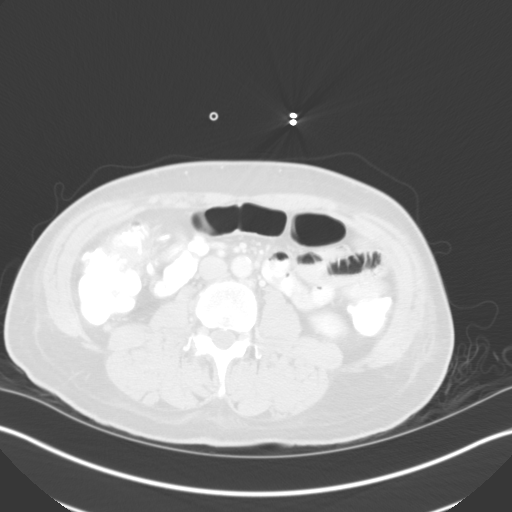
[im 69/131  lung]
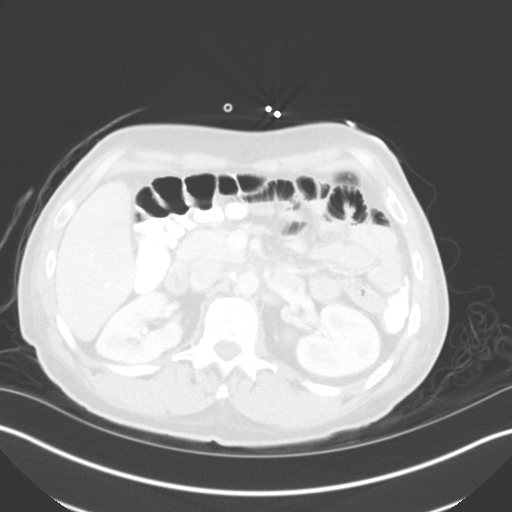
[im 77/131  lung]
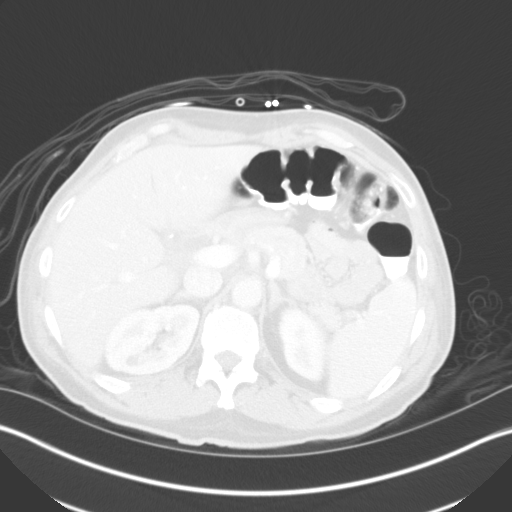
[im 85/131  lung]
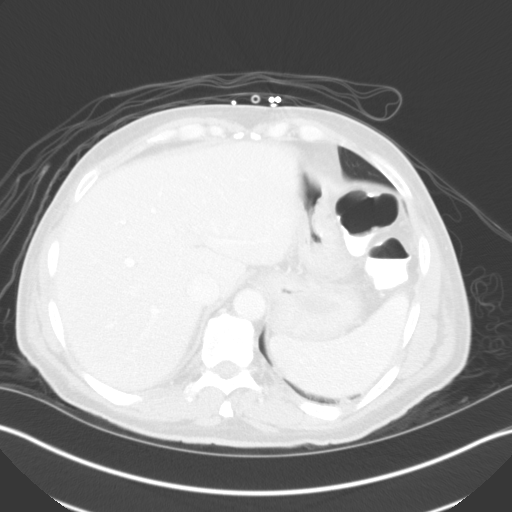
[im 100/131  mediastinal]
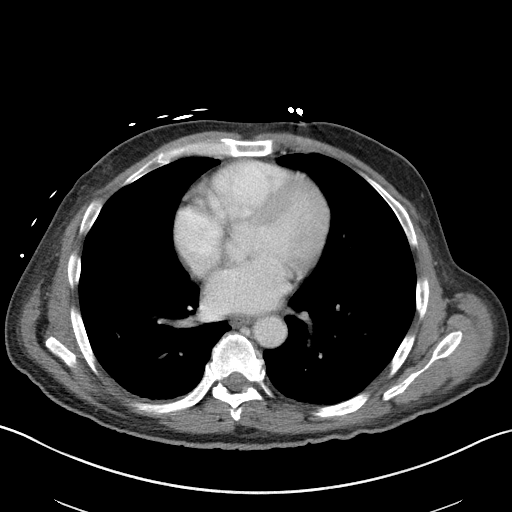
[im 100/131  lung]
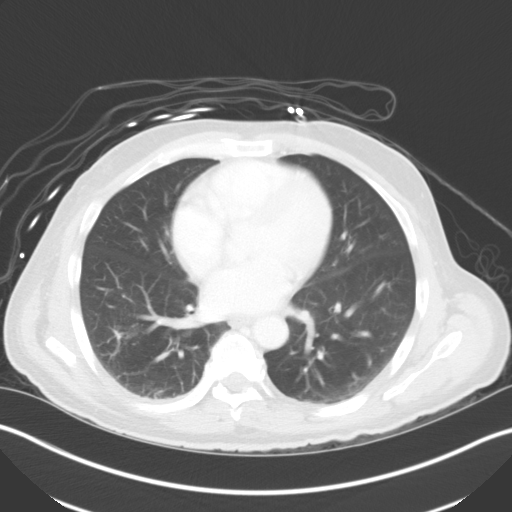
[im 108/131  lung]
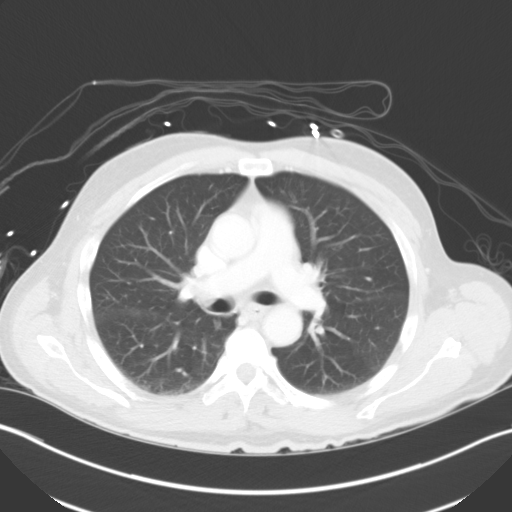
[im 123/131  lung]
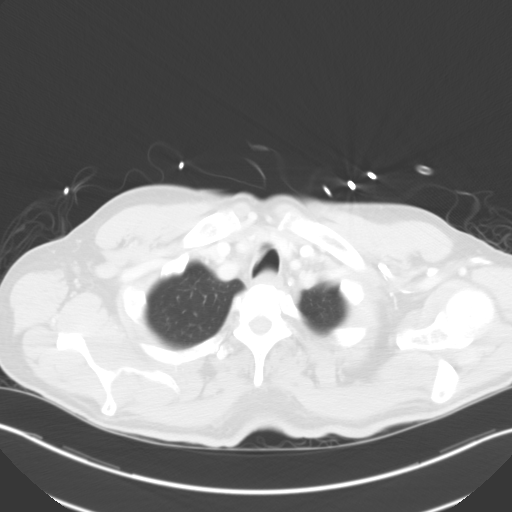

[Series 5: coronal · coronal · 0.92mm/px · 3 of 101 slices shown]
[im 41/101  lung]
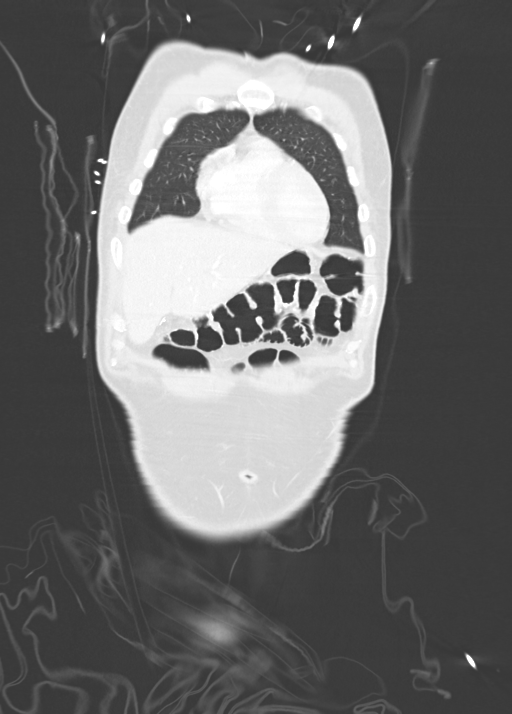
[im 61/101  lung]
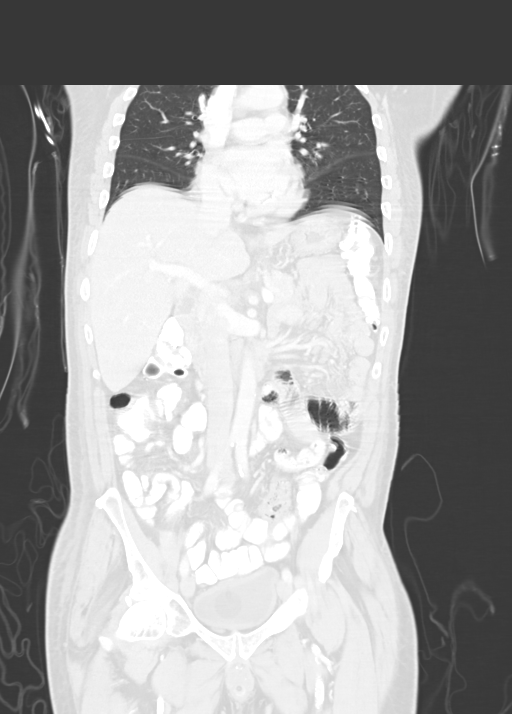
[im 81/101  lung]
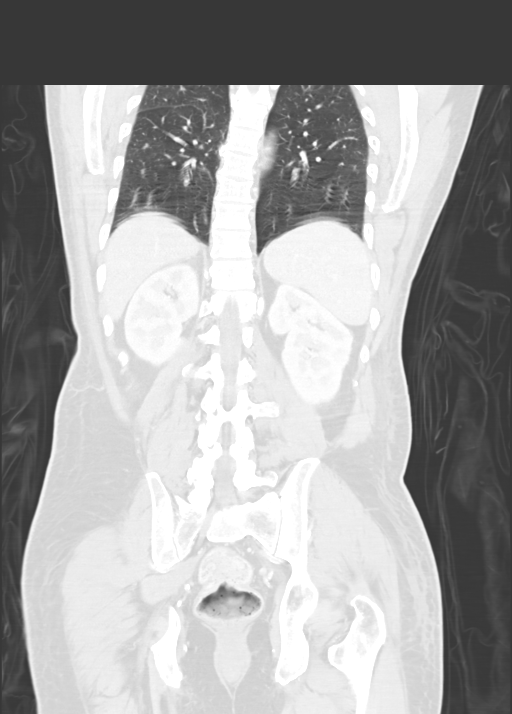

[14 of 36 positions shown; findings below may reference images not displayed]

FINDINGS: CT CHEST FINDINGS

There is mild bilateral dependent atelectasis. No pleural or
pericardial effusion.

Thoracic inlet is normal. Mediastinal contents normal. Heart size
normal. Mild calcification mitral valve.

No significant hilar or mediastinal adenopathy. Other than
atelectasis, the lungs are clear. There are no acute musculoskeletal
abnormalities involving the bony thorax.

CT ABDOMEN AND PELVIS FINDINGS

Liver is normal. Gallbladder is surgically absent and there is mild
dilatation of the common bile duct which is likely the result of
prior cholecystectomy.

Pancreas is normal.  Spleen is normal.  Stomach is normal.

Adrenal glands are normal. Kidneys are normal. Abdominal aorta shows
no dilatation but does demonstrate mild calcification. There is mild
calcification at the origin of the right renal artery as well. There
is mild calcification of the iliac arteries.

A Foley catheter decompresses the bladder. Air within the lumen of
the bladder is likely related to placement of Foley catheter.
Reproductive organs show no acute abnormalities.

There is no ascites. There is no significant adenopathy in the
abdomen or pelvis. There are no acute musculoskeletal abnormalities
in the abdomen and pelvis.

Small bowel is normal. Appendix is normal. There is a mixture of
stool and fluid in the rectum. Rectum is distended to about 6.7 cm.
Mild narrowing mid to distal sigmoid colon may be due to
peristalsis.
IMPRESSION: 1. No acute abnormality in the thorax
2. No acute abnormality in the abdomen or pelvis.
3. Fluid into the rectum consistent with history of diarrhea and
hemorrhage. No definitive specific bowel abnormalities. Narrowing
sigmoid colon may simply be related to peristalsis. Underlying mass
not excluded and colonoscopy or barium enema suggested if there is
concern for large bowel malignancy.
